# Patient Record
Sex: Male | Born: 1985 | Race: White | Hispanic: No | Marital: Single | Smoking: Never smoker
Health system: Southern US, Community
[De-identification: ages and names within clinical notes are randomized; demographics above are authoritative.]

## PROBLEM LIST (undated history)

## (undated) DIAGNOSIS — F3112 Bipolar disorder, current episode manic without psychotic features, moderate: Secondary | ICD-10-CM

## (undated) DIAGNOSIS — E669 Obesity, unspecified: Secondary | ICD-10-CM

## (undated) DIAGNOSIS — R569 Unspecified convulsions: Secondary | ICD-10-CM

## (undated) DIAGNOSIS — I10 Essential (primary) hypertension: Secondary | ICD-10-CM

## (undated) DIAGNOSIS — M722 Plantar fascial fibromatosis: Secondary | ICD-10-CM

## (undated) HISTORY — PX: TONSILECTOMY, ADENOIDECTOMY, BILATERAL MYRINGOTOMY AND TUBES: SHX2538

## (undated) HISTORY — DX: Plantar fascial fibromatosis: M72.2

## (undated) HISTORY — DX: Unspecified convulsions: R56.9

## (undated) HISTORY — DX: Essential (primary) hypertension: I10

## (undated) HISTORY — DX: Bipolar disorder, current episode manic without psychotic features, moderate: F31.12

---
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
## 2007-02-02 ENCOUNTER — Emergency Department (HOSPITAL_COMMUNITY): Admission: EM | Admit: 2007-02-02 | Discharge: 2007-02-02 | Payer: Self-pay | Admitting: Emergency Medicine

## 2010-05-12 ENCOUNTER — Emergency Department (HOSPITAL_COMMUNITY): Payer: No Typology Code available for payment source

## 2010-05-12 ENCOUNTER — Emergency Department (HOSPITAL_COMMUNITY)
Admission: EM | Admit: 2010-05-12 | Discharge: 2010-05-12 | Disposition: A | Discharge status: Home / Self Care | Payer: No Typology Code available for payment source | Attending: Emergency Medicine | Admitting: Emergency Medicine

## 2010-05-12 DIAGNOSIS — M25519 Pain in unspecified shoulder: Secondary | ICD-10-CM | POA: Insufficient documentation

## 2010-05-12 DIAGNOSIS — M542 Cervicalgia: Secondary | ICD-10-CM | POA: Insufficient documentation

## 2010-05-12 DIAGNOSIS — M79609 Pain in unspecified limb: Secondary | ICD-10-CM | POA: Insufficient documentation

## 2010-05-12 DIAGNOSIS — F319 Bipolar disorder, unspecified: Secondary | ICD-10-CM | POA: Insufficient documentation

## 2010-05-12 DIAGNOSIS — I10 Essential (primary) hypertension: Secondary | ICD-10-CM | POA: Insufficient documentation

## 2010-05-12 DIAGNOSIS — M549 Dorsalgia, unspecified: Secondary | ICD-10-CM | POA: Insufficient documentation

## 2010-11-14 ENCOUNTER — Encounter: Payer: Self-pay | Admitting: Family Medicine

## 2010-11-15 ENCOUNTER — Encounter: Payer: Self-pay | Admitting: Family Medicine

## 2010-11-15 ENCOUNTER — Ambulatory Visit (INDEPENDENT_AMBULATORY_CARE_PROVIDER_SITE_OTHER): Payer: 59 | Admitting: Family Medicine

## 2010-11-15 VITALS — BP 151/102 | HR 78 | Ht 70.5 in | Wt 310.0 lb

## 2010-11-15 DIAGNOSIS — I10 Essential (primary) hypertension: Secondary | ICD-10-CM

## 2010-11-15 DIAGNOSIS — F319 Bipolar disorder, unspecified: Secondary | ICD-10-CM

## 2010-11-15 DIAGNOSIS — M79609 Pain in unspecified limb: Secondary | ICD-10-CM

## 2010-11-15 DIAGNOSIS — M79606 Pain in leg, unspecified: Secondary | ICD-10-CM | POA: Insufficient documentation

## 2010-11-15 MED ORDER — HYDROCHLOROTHIAZIDE 25 MG PO TABS: 25.0000 mg | ORAL_TABLET | Freq: Every day | ORAL | Status: DC

## 2010-11-15 NOTE — Assessment & Plan Note (Signed)
Prescribed lifestyle changes to help with excess weight. Likely related to burden of excess weight with prolonged standing. Will monitor.

## 2010-11-15 NOTE — Assessment & Plan Note (Signed)
Plan for healthy lifestyle changes. Will see patient back in 6-8 weeks to evaluate progress.  1. Exercise 5x a week for 30 minutes 2. Eat 5 servings of fruits and vegetables.  3. Cut down on your sweet tea intake (no sweet tea at home, only 1 sweet tea at work per day).

## 2010-11-15 NOTE — Assessment & Plan Note (Addendum)
Poorly controlled not on any medications. Will start with hydrochlorothiazide as sole therapy for now but suspect will likely need additional medication for control. Given obesity-will start with lifestyle changes-see obesity problem.   Will check fasting lipids (given risk factors)  before next visit as well as a CMET (electrolyte abnormalities)

## 2010-11-15 NOTE — Patient Instructions (Addendum)
It was a pleasure to meet you today!  I am glad I get to be your new physician.   I would like you to schedule an appointment in approximately 6 weeks. Come to clinic 1 week before your appointment to get your blood drawn (be sure to not eat anything after 10pm the night before, water is ok though).   For your blood pressure, I have prescribed hydrochlorothiazide 25 mg. Take this everyday. Also, we talked about some healthy lifestyle changes: 1. Exercise 5x a week for 30 minutes 2. Eat 5 servings of fruits and vegetables.  3. Cut down on your sweet tea intake (no sweet tea at home, only 1 sweet tea at work per day).   I think making these changes and possibly losing some weight should also help the pain in your knees after work.

## 2010-11-15 NOTE — Progress Notes (Signed)
  Subjective:    Patient ID: Juan Grimes, male    DOB: [birth year], 25 y.o.   MRN: [id number]  HPI Juan Grimes is a 25 year old with a past medical history of Bipolar Type I, HTN, and  history of a seizure in 2008 presenting to establish care.   1. Bipolar Type I-on multiple medications including lamictal, saphris, and equetro. These medications are managed by a local psychiatrist. Symptoms well controlled on this regimen.   2. HTN-previously on nifedipine and HCTZ. Has not previously tried lifestyle modifications. Willing to attempt to make healthy lifestyle choices  3. Health Maintenance- Lipid Screening-will check fasting lipids given risk factors (hypertension, obesity).  Flu shot-refuses and hasnt received Tetanus-8 years ago. Patient will need within the next 2 years.  Records-will fax to obtain copy of records.   4. Knee pain-1-2x per week with sharp pain in knees in calves lasting less than 5 minutes after returning home from work and being on feet for long period of time. Resolves within a few minutes. Does not take any medications for relief.    PMhx-not a smoker. Reviewed.  Review of Systems-negative except as noted in HPI       Objective:                  Physical Exam  Vitals reviewed. Constitutional: He is oriented to person, place, and time. He appears well-developed and well-nourished. No distress.       obese  HENT:  Head: Normocephalic and atraumatic.  Mouth/Throat: No oropharyngeal exudate.  Eyes: Conjunctivae are normal. Pupils are equal, round, and reactive to light. Right eye exhibits no discharge. Left eye exhibits no discharge. No scleral icterus.  Neck: Normal range of motion. Neck supple.  Cardiovascular: Normal rate, regular rhythm, normal heart sounds and intact distal pulses.  Exam reveals no gallop and no friction rub.   No murmur heard. Pulmonary/Chest: Effort normal and breath sounds normal. No respiratory distress. He has no wheezes. He  has no rales. He exhibits no tenderness.  Abdominal: Soft. Bowel sounds are normal. He exhibits no distension. There is no tenderness. There is no rebound.  Musculoskeletal: Normal range of motion. He exhibits no edema and no tenderness.  Neurological: He is alert and oriented to person, place, and time. He has normal reflexes.  Skin: Skin is warm and dry. No rash noted. He is not diaphoretic.  Psychiatric: He has a normal mood and affect. His behavior is normal.          Assessment & Plan:

## 2010-11-25 ENCOUNTER — Telehealth: Payer: Self-pay | Admitting: Family Medicine

## 2010-11-25 ENCOUNTER — Encounter: Payer: Self-pay | Admitting: Family Medicine

## 2010-11-25 DIAGNOSIS — Z9989 Dependence on other enabling machines and devices: Secondary | ICD-10-CM

## 2010-11-25 DIAGNOSIS — G4733 Obstructive sleep apnea (adult) (pediatric): Secondary | ICD-10-CM | POA: Insufficient documentation

## 2010-11-25 DIAGNOSIS — R569 Unspecified convulsions: Secondary | ICD-10-CM | POA: Insufficient documentation

## 2010-11-25 NOTE — Telephone Encounter (Signed)
[employer]. We received medical records from their office. Records mentioned history of one seizure on 02/02/2007 and said patient had MRI/EEG scheduled and that he had seen Neuro. The records for a neuro consult and EEG and MRI were not available.   Medical Records was going to investigate to find these records and then either fax the information or give [place] a call if unable to obtain.

## 2010-12-14 LAB — DIFFERENTIAL
Basophils Absolute: 0
Eosinophils Relative: 0
Lymphocytes Relative: 7 — ABNORMAL LOW
Neutro Abs: 14.2 — ABNORMAL HIGH

## 2010-12-14 LAB — I-STAT 8, (EC8 V) (CONVERTED LAB)
Bicarbonate: 23.7
Glucose, Bld: 115 — ABNORMAL HIGH
Hemoglobin: 16.7
Sodium: 140
TCO2: 25

## 2010-12-14 LAB — POCT I-STAT CREATININE: Operator id: 192351

## 2010-12-14 LAB — CBC
HCT: 44.2
Platelets: 246
RDW: 13.7

## 2010-12-14 LAB — RAPID URINE DRUG SCREEN, HOSP PERFORMED
Amphetamines: NOT DETECTED
Benzodiazepines: NOT DETECTED
Cocaine: NOT DETECTED
Tetrahydrocannabinol: NOT DETECTED

## 2010-12-14 LAB — ETHANOL: Alcohol, Ethyl (B): <5

## 2011-03-24 ENCOUNTER — Ambulatory Visit (INDEPENDENT_AMBULATORY_CARE_PROVIDER_SITE_OTHER): Payer: 59 | Admitting: Family Medicine

## 2011-03-24 ENCOUNTER — Encounter: Payer: Self-pay | Admitting: Family Medicine

## 2011-03-24 VITALS — BP 148/87 | HR 84 | Temp 98.0°F | Ht 70.5 in | Wt 322.0 lb

## 2011-03-24 DIAGNOSIS — I10 Essential (primary) hypertension: Secondary | ICD-10-CM

## 2011-03-24 DIAGNOSIS — B356 Tinea cruris: Secondary | ICD-10-CM | POA: Insufficient documentation

## 2011-03-24 DIAGNOSIS — F319 Bipolar disorder, unspecified: Secondary | ICD-10-CM

## 2011-03-24 LAB — COMPREHENSIVE METABOLIC PANEL
AST: 18 U/L (ref 0–37)
BUN: 14 mg/dL (ref 6–23)
CO2: 28 mEq/L (ref 19–32)
Calcium: 9.1 mg/dL (ref 8.4–10.5)
Chloride: 103 mEq/L (ref 96–112)
Creat: 0.73 mg/dL (ref 0.50–1.35)
Glucose, Bld: 91 mg/dL (ref 70–99)

## 2011-03-24 LAB — POCT GLYCOSYLATED HEMOGLOBIN (HGB A1C): Hemoglobin A1C: 5.2

## 2011-03-24 MED ORDER — LISINOPRIL 10 MG PO TABS: 10.0000 mg | ORAL_TABLET | Freq: Every day | ORAL | Status: DC

## 2011-03-24 MED ORDER — TERBINAFINE HCL 1 % EX CREA: TOPICAL_CREAM | Freq: Every day | CUTANEOUS | Status: DC

## 2011-03-24 NOTE — Assessment & Plan Note (Signed)
Will give lamisil cream for 1 week. Can use 2 more weeks if not completely resolved. If not better at that time, patient to come back.

## 2011-03-24 NOTE — Assessment & Plan Note (Addendum)
Will add second agent, lisinopril as SBP still not at goal while Diastolic now with better control. Will check cmet at this visit and recheck bmet next visit to monitor Cr. Check lipids. A1c 5.2-will need to tell patient at next visit.

## 2011-03-24 NOTE — Assessment & Plan Note (Signed)
Continued to encourage lifestyle changes. Patient not currently interested in exercise. He is willing to try to cut back on sweet tea and drink more water (2-3 cups per day). 12 lbs weight gain. Warned patient of long term risks of obesity.

## 2011-03-24 NOTE — Assessment & Plan Note (Signed)
Patient to follow up with psychiatrist due to decreased energy level on medicines. Reviewed Lisinopril with psych meds and no interactions noted.

## 2011-03-24 NOTE — Progress Notes (Signed)
  Subjective:    Patient ID: Juan Grimes, male    DOB: 01/08/1986, 26 y.o.   MRN: [id number]  Juan Grimes is a 26 year old with a past medical history of Bipolar Type I, HTN, and  history of a seizure in 2008 presenting to establish care.   1. HTN-previously on nifedipine and HCTZ. Reported willing to try lifestyle modifications at last visit but did not complete any of these. He has been on HCTZ without issue. Willing to take second agent. No red flags.   2. Patient reports groin itching for 2 months. Says temporarily better after shower. Not tender. Not on scrotum or penis. No burning with urination or polyuria.     3. Bipolar Type I-on multiple medications including lamictal, saphris, and equetro. Patient reports he will see his psychiatrist soon as his energy level has been down. As a result, he has not exercised or been very active outside fo the house. Says rain never affects him positively and there has been a lot lately.   Review of Systems negative except as noted in HPI       Objective:   Physical Exam  Constitutional: He is oriented to person, place, and time. No distress.       Morbidly obese  HENT:  Head: Normocephalic and atraumatic.  Neck: Normal range of motion. Neck supple.  Cardiovascular: Normal rate and regular rhythm.  Exam reveals no gallop and no friction rub.   No murmur heard. Pulmonary/Chest: Effort normal and breath sounds normal. No respiratory distress. He has no wheezes. He has no rales.  Abdominal: Soft. Bowel sounds are normal. He exhibits no distension. There is no tenderness.  Genitourinary: Penis normal. No penile tenderness.       Inspected groin which had a moist appearance and some mild erythema.    Musculoskeletal: Normal range of motion. He exhibits no edema.  Neurological: He is alert and oriented to person, place, and time.  Skin: Skin is warm and dry. He is not diaphoretic.       With exception of moist groin-see genitourinary exam.            Assessment & Plan:  Health Maintenance- Lipid Screening-will check direct LDL (hypertension, obesity).  Flu shot-refuses  Tetanus-reported 4 years ago and otherwise refusing any shots.

## 2011-03-24 NOTE — Patient Instructions (Addendum)
It was good to see you again today, Juan Grimes.   To review: 1. Blood pressure-we are starting a second medicine today, Lisinopril.  2. We are going to check some labs including one for diabetes. I will contact you if there are any changes to your medications based off of labs.  3. For your jock itch, I have sent in a prescription for you.  4. Please try to drink 2-3 cups of water a day and cut down on sweet tea intake. Try unsweetened tea as an alternative.  5. Please see your psychiatrist soon. When you have the energy, I would like for you to try to exercise more.   Thanks, Dr. Durene Cal.

## 2011-03-25 ENCOUNTER — Encounter: Payer: Self-pay | Admitting: Family Medicine

## 2011-03-25 LAB — LDL CHOLESTEROL, DIRECT: Direct LDL: 121 mg/dL — ABNORMAL HIGH

## 2011-05-02 ENCOUNTER — Encounter: Payer: Self-pay | Admitting: Family Medicine

## 2011-05-02 ENCOUNTER — Ambulatory Visit (INDEPENDENT_AMBULATORY_CARE_PROVIDER_SITE_OTHER): Payer: 59 | Admitting: Family Medicine

## 2011-05-02 VITALS — BP 149/79 | HR 91 | Temp 98.3°F | Ht 70.5 in | Wt 315.0 lb

## 2011-05-02 DIAGNOSIS — Z9989 Dependence on other enabling machines and devices: Secondary | ICD-10-CM

## 2011-05-02 DIAGNOSIS — I10 Essential (primary) hypertension: Secondary | ICD-10-CM

## 2011-05-02 DIAGNOSIS — B356 Tinea cruris: Secondary | ICD-10-CM

## 2011-05-02 DIAGNOSIS — G4733 Obstructive sleep apnea (adult) (pediatric): Secondary | ICD-10-CM

## 2011-05-02 MED ORDER — HYDROCHLOROTHIAZIDE 25 MG PO TABS: 25.0000 mg | ORAL_TABLET | Freq: Every day | ORAL | Status: DC

## 2011-05-02 NOTE — Assessment & Plan Note (Signed)
Encouraged lifestyle changes (see AVS).

## 2011-05-02 NOTE — Progress Notes (Signed)
  Subjective:    Patient ID: Juan Grimes, male    DOB: [birth year], 26 y.o.   MRN: [id number]  HPIJoseph is a 26 year old with a past medical history of Bipolar Type I, HTN, OSA previously on CPAP presenting for HTN f/u.   1. HTN-tolerating Lisinopril and HCTZ without side effects. SBP still elevated but with DBP with good control. Patient has tried some lifestyle changes since last visit (increased fruits and veggies, stopping drinking sweet tea, and being more active) resulting in 7 lbs weight loss. Seems motivated today to continue these changes. See #2 For OSA interactions. Would prefer to have labs drawn at next visit for electrolytes.   2. OSA-patient reports he has machine but cannot afford equipment  3. Tinea Cruris-improved with Lamisil spray (couldn't afford cream). No longer itching. No longer taking medications.    4. Bipolar Type I-energy level down at last visit. Improved this visit-says he was in a "slump" but exercise may have helped. Psychiatrist tried to prescribe dexedrine but patient couldn't afford.   Review of Systemsnegative except as noted in HPI      Objective:   Physical Exam  Constitutional: He is oriented to person, place, and time. He appears well-developed and well-nourished. No distress.  HENT:  Head: Normocephalic and atraumatic.  Mouth/Throat: Oropharynx is clear and moist. No oropharyngeal exudate.  Eyes: Pupils are equal, round, and reactive to light.  Neck: Normal range of motion. Neck supple.  Cardiovascular: Normal rate and regular rhythm.  Exam reveals no gallop and no friction rub.   No murmur heard. Pulmonary/Chest: Effort normal and breath sounds normal. No respiratory distress. He has no wheezes. He has no rales.  Abdominal: Soft. Bowel sounds are normal. He exhibits no distension. There is no tenderness. There is no rebound.  Musculoskeletal: Normal range of motion. He exhibits no edema.  Neurological: He is alert and oriented to person,  place, and time.  Skin: Skin is warm and dry.      Assessment & Plan:  BMET next visit for being on Lisinopril. F/u 6 weeks.

## 2011-05-02 NOTE — Assessment & Plan Note (Addendum)
BMET next visit. SBP still high on Lisinopril/HCTZ but DBP well controlled. OSA not being treated as patient cant afford equipment-likely contributing.  Patient plans for lifestyle changes. Alternatively, can increase Lisinopril to 20mg . On 03/24/11-A1c 5.2. LDL 121. Goal <160 due to only 1 risk factor.  Updated patient on labs.

## 2011-05-02 NOTE — Assessment & Plan Note (Signed)
Patient not taking CPAP due affordability of equipment. Likely contributing to HTN.

## 2011-05-02 NOTE — Assessment & Plan Note (Signed)
Much improved with lamisil spray.

## 2011-05-02 NOTE — Patient Instructions (Addendum)
Dear Mr. Daeshon Grammatico [facility],   It was great to see you today. Thank you for coming to clinic. Please read below regarding the issues that we discussed.   1. Blood pressure-the Lisinopril has helped your bottom number improve. Your top number is still high. I think we can improve this number by continuing healthy lifestyle changes. You lost 7 lbs in 1 month due to stopping drinking tea and increasing fruits and vegetables -I want you to start walking or doing another form of exercise for 15-20 minutes per day 4x per week.  -also try to increase vegetables to a minimum of 2-3 servings per day and fruits to a minimum of 3-4 servings (that will get you 5 total) 2. For your jock itch, I am glad the Lamisil worked for you. If it ever flares up again, you can try the lamisil again.  3. I am glad your energy level improved with exercise.  4. Your lab work looked great from your last visit.   Please follow up in clinic in 6 weeks . Please call earlier if you have any questions or concerns.   Sincerely,  Dr. Tana Conch

## 2011-06-14 ENCOUNTER — Encounter: Payer: Self-pay | Admitting: Family Medicine

## 2011-06-14 ENCOUNTER — Ambulatory Visit (INDEPENDENT_AMBULATORY_CARE_PROVIDER_SITE_OTHER): Payer: 59 | Admitting: Family Medicine

## 2011-06-14 VITALS — BP 137/79 | HR 89 | Temp 98.3°F | Ht 70.5 in | Wt 307.0 lb

## 2011-06-14 DIAGNOSIS — I10 Essential (primary) hypertension: Secondary | ICD-10-CM

## 2011-06-14 DIAGNOSIS — G4733 Obstructive sleep apnea (adult) (pediatric): Secondary | ICD-10-CM

## 2011-06-14 DIAGNOSIS — F319 Bipolar disorder, unspecified: Secondary | ICD-10-CM

## 2011-06-14 NOTE — Progress Notes (Addendum)
Patient ID: NICHOLE Grimes, male   DOB: 1985/12/08, 26 y.o.   MRN: [id number]  Subjective:    Patient ID: Juan Grimes, male    DOB: 1986/03/07, 26 y.o.   MRN: [id number]  HPIJoseph is a 26 year old with a past medical history of Bipolar Type I, HTN, OSA previously on CPAP presenting for HTN f/u.   1. HTN-tolerating Lisinopril and HCTZ without side effects. BP now at goal <140/90. Patient continues efforts for lifestyle changes since last visit (increased fruits and veggies, stopping drinking sweet tea, and being more active) resulting in an additional 8 lbs weight loss (now down 15 lbs in total from peak) Very motivated today to continue these changes.  Denies CP, SOB, blurry vision, headaches.   2. OSA-patient reports he has machine but cannot afford equipment. He is unsure if he is snoring. Does have some daytime somnolence.   3. Obesity-eating more salads, smaller portion sizes, still not drinking tea (occaasional light lemonade with some tea in it). 2-3 veggies per day. 1-2 fruits per day.   4. Bipolar Type I-energy level continues to be improved with exercise. Continues to follow up with Psychiatry   Review of Systems -See HPI  Past Medical History-smoking status noted: never smoker. Reviewed problem list.  Medications- reviewed and updated Chief complaint-noted      Objective:   Physical Exam  Constitutional: He is oriented to person, place, and time. He appears well-developed and well-nourished. No distress.  HENT:  Head: Normocephalic and atraumatic.  Mouth/Throat: Oropharynx is clear and moist. No oropharyngeal exudate.  Eyes: Pupils are equal, round, and reactive to light.  Neck: Normal range of motion. Neck supple.  Cardiovascular: Normal rate and regular rhythm.  Exam reveals no gallop and no friction rub.   No murmur heard. Pulmonary/Chest: Effort normal and breath sounds normal. No respiratory distress. He has no wheezes. He has no rales.  Abdominal: Soft. Bowel  sounds are normal. He exhibits no distension. There is no tenderness. There is no rebound.  Musculoskeletal: Normal range of motion. He exhibits no edema.  Neurological: He is alert and oriented to person, place, and time.  Skin: Skin is warm and dry.      Assessment & Plan:  F/u 2 months

## 2011-06-14 NOTE — Assessment & Plan Note (Signed)
Can't afford equipment. OSA treatment at this time by treating obesity. Patient will let us know when he can afford equipment. Does have some daytime somnolence. Unsure if snoring.

## 2011-06-14 NOTE — Assessment & Plan Note (Signed)
Improved energy levels. Patient seems to be in bright spirits. Laughs and very interactive-which has improved in recent visits. Continues to follow up with Psychiatry.

## 2011-06-14 NOTE — Patient Instructions (Signed)
Dear  Lytle Michaels,   It was great to see you today. Thank you for coming to clinic. Please read below regarding the issues that we discussed.   1. Blood pressure-has improved and is now at goal of < 140/90. This is likely related to all your hard work with exercise and eating better. Keep taking the same medications.  2. Weight loss-you have now lost a total of 15 lbs from your heaviest -I want you to continue your increased activity outdoors. Your goal that you set is 15-20 minutes per day 4x per week. I want you to keep striving for that goal.  -You have increased your fruits and vegetables. Keep aiming for your goal of vegetables a minimum of 2-3 servings per day and fruits to a minimum of 3-4 servings (that will get you 5 total)  3. Sleep apnea-if you ever feel like you are in a position to afford the machine again, please let us know and we will help you out with that.  4. We will send you for labs today. I will send you your results by mail.    Please follow up in clinic in 8 weeks . Please call earlier if you have any questions or concerns.   Sincerely,  Dr. Tana Conch

## 2011-06-14 NOTE — Assessment & Plan Note (Signed)
Blood pressure finally at goal, likely due to weight loss, improved diet. Will follow up in 2 months. BMET today to monitor electrolytes.

## 2011-06-14 NOTE — Assessment & Plan Note (Signed)
Plan per AVS. Patient has short term goal of weight <300 lbs.

## 2011-06-15 LAB — BASIC METABOLIC PANEL
Potassium: 3.7 mEq/L (ref 3.5–5.3)
Sodium: 141 mEq/L (ref 135–145)

## 2011-06-19 ENCOUNTER — Encounter: Payer: Self-pay | Admitting: Family Medicine

## 2012-05-03 ENCOUNTER — Other Ambulatory Visit: Payer: Self-pay | Admitting: Family Medicine

## 2012-05-13 ENCOUNTER — Other Ambulatory Visit: Payer: Self-pay | Admitting: Family Medicine

## 2012-06-15 ENCOUNTER — Encounter (HOSPITAL_COMMUNITY): Payer: Self-pay | Admitting: Emergency Medicine

## 2012-06-15 ENCOUNTER — Emergency Department (HOSPITAL_COMMUNITY)
Admission: EM | Admit: 2012-06-15 | Discharge: 2012-06-15 | Disposition: A | Discharge status: Home / Self Care | Payer: Self-pay | Attending: Emergency Medicine | Admitting: Emergency Medicine

## 2012-06-15 DIAGNOSIS — I10 Essential (primary) hypertension: Secondary | ICD-10-CM | POA: Insufficient documentation

## 2012-06-15 DIAGNOSIS — W260XXA Contact with knife, initial encounter: Secondary | ICD-10-CM | POA: Insufficient documentation

## 2012-06-15 DIAGNOSIS — Y92009 Unspecified place in unspecified non-institutional (private) residence as the place of occurrence of the external cause: Secondary | ICD-10-CM | POA: Insufficient documentation

## 2012-06-15 DIAGNOSIS — E669 Obesity, unspecified: Secondary | ICD-10-CM | POA: Insufficient documentation

## 2012-06-15 DIAGNOSIS — Z8739 Personal history of other diseases of the musculoskeletal system and connective tissue: Secondary | ICD-10-CM | POA: Insufficient documentation

## 2012-06-15 DIAGNOSIS — Z23 Encounter for immunization: Secondary | ICD-10-CM | POA: Insufficient documentation

## 2012-06-15 DIAGNOSIS — Y93G1 Activity, food preparation and clean up: Secondary | ICD-10-CM | POA: Insufficient documentation

## 2012-06-15 DIAGNOSIS — S61209A Unspecified open wound of unspecified finger without damage to nail, initial encounter: Secondary | ICD-10-CM | POA: Insufficient documentation

## 2012-06-15 DIAGNOSIS — G40909 Epilepsy, unspecified, not intractable, without status epilepticus: Secondary | ICD-10-CM | POA: Insufficient documentation

## 2012-06-15 DIAGNOSIS — F319 Bipolar disorder, unspecified: Secondary | ICD-10-CM | POA: Insufficient documentation

## 2012-06-15 DIAGNOSIS — Z79899 Other long term (current) drug therapy: Secondary | ICD-10-CM | POA: Insufficient documentation

## 2012-06-15 HISTORY — DX: Obesity, unspecified: E66.9

## 2012-06-15 MED ORDER — HYDROCODONE-ACETAMINOPHEN 5-325 MG PO TABS: 1.0000 | ORAL_TABLET | Freq: Four times a day (QID) | ORAL | Status: DC | PRN

## 2012-06-15 MED ORDER — HYDROCODONE-ACETAMINOPHEN 5-325 MG PO TABS
2.0000 | ORAL_TABLET | Freq: Once | ORAL | Status: AC
  Administered 2012-06-15: 2 via ORAL
  Filled 2012-06-15: qty 2

## 2012-06-15 MED ORDER — TETANUS-DIPHTH-ACELL PERTUSSIS 5-2.5-18.5 LF-MCG/0.5 IM SUSP
0.5000 mL | Freq: Once | INTRAMUSCULAR | Status: AC
  Administered 2012-06-15: 0.5 mL via INTRAMUSCULAR
  Filled 2012-06-15: qty 0.5

## 2012-06-15 MED ORDER — CEPHALEXIN 500 MG PO CAPS: 500.0000 mg | ORAL_CAPSULE | Freq: Four times a day (QID) | ORAL | Status: DC

## 2012-06-15 MED ORDER — CEPHALEXIN 250 MG PO CAPS
1000.0000 mg | ORAL_CAPSULE | Freq: Once | ORAL | Status: AC
  Administered 2012-06-15: 1000 mg via ORAL
  Filled 2012-06-15: qty 4

## 2012-06-15 NOTE — ED Notes (Addendum)
PT. ACCIDENTALLY SLICED SKIN OF HIS RIGHT MIDDLE FINGER BY A POTATO CUTTER THIS EVENING AT HOME . PRESSURE APPLIED AT TRIAGE.

## 2012-06-15 NOTE — ED Provider Notes (Addendum)
History     CSN: [id number]  Arrival date & time 06/15/12  0102   First MD Initiated Contact with Patient 06/15/12 [phone]      Chief Complaint  Patient presents with  . Finger Injury    (Consider location/radiation/quality/duration/timing/severity/associated sxs/prior treatment) HPI This is a 27 year old male who was cutting potatoes this morning and slipped, slicing a piece of skin off the dorsum of his right middle finger at about the distal interphalangeal joint. There was severe bleeding at the time, which was "squirting" but has resolved with application of a pressure dressing in triage. There is moderate pain associated with this. Tendon function and sensation are intact. He denies other injury. He has not had a tetanus shot in the past 10 years.  Past Medical History  Diagnosis Date  . Bipolar 1 disorder, manic, moderate   . Seizure     reported massive seizure in 2008  . Plantar fasciitis   . HTN (hypertension)     normal renin 2008  . Obese     Past Surgical History  Procedure Laterality Date  . Tonsilectomy, adenoidectomy, bilateral myringotomy and tubes      1996    Family History  Problem Relation Age of Onset  . Bipolar disorder Father   . Hypertension Father   . Fibromyalgia Mother     History  Substance Use Topics  . Smoking status: Never Smoker   . Smokeless tobacco: Not on file  . Alcohol Use: 1.0 oz/week    2 drink(s) per week      Review of Systems  All other systems reviewed and are negative.    Allergies  Review of patient's allergies indicates no known allergies.  Home Medications   Current Outpatient Rx  Name  Route  Sig  Dispense  Refill  . hydrochlorothiazide (HYDRODIURIL) 25 MG tablet   Oral   Take 25 mg by mouth daily.         Marland Kitchen lamoTRIgine (LAMICTAL) 200 MG tablet   Oral   Take 200 mg by mouth 2 (two) times daily.          Marland Kitchen lisinopril (PRINIVIL,ZESTRIL) 10 MG tablet   Oral   Take 1 tablet (10 mg total) by mouth  daily.   90 tablet   3   . LORazepam (ATIVAN) 1 MG tablet   Oral   Take 2 mg by mouth every 6 (six) hours as needed for anxiety.         Marland Kitchen QUEtiapine (SEROQUEL XR) 200 MG 24 hr tablet   Oral   Take 200 mg by mouth at bedtime.           BP 178/100  Pulse 92  Temp(Src) 99.1 F (37.3 C) (Oral)  Resp 14  SpO2 97%  Physical Exam General: Well-developed, well-nourished male in no acute distress; appearance consistent with age of record HENT: normocephalic, atraumatic Eyes: pupils equal round and reactive to light; extraocular muscles intact Neck: supple Heart: regular rate and rhythm Lungs: clear to auscultation bilaterally Abdomen: soft; nondistended Extremities: No deformity; full range of motion; pulses normal; skin defect about 1.5 x 2 cm overlying the dorsolateral aspect of the right middle finger at the distal interphalangeal joint; flexion and extension function are intact at the distal interphalangeal joint of the right middle finger, sensation is intact distally and distal capillary refill is brisk Neurologic: Awake, alert and oriented; motor function intact in all extremities and symmetric; no facial droop Skin: Warm and dry Psychiatric: Normal  mood and affect    ED Course  Procedures (including critical care time)    MDM  The wound is not amenable to primary repair. He does not appear to involve deep structures, no tendon or joint is seen in the wound. It may require skin grafting. We will refer to Dr. Amanda Pea and have him evaluate the wound later today.  We will dress the wound with Xeroform gauze and instruct him regarding dressing changes.       Hanley Seamen, MD 06/15/12 1610  Hanley Seamen, MD 06/15/12 [phone]

## 2012-06-26 ENCOUNTER — Emergency Department (HOSPITAL_COMMUNITY)
Admission: EM | Admit: 2012-06-26 | Discharge: 2012-06-26 | Disposition: A | Discharge status: Home / Self Care | Payer: Self-pay | Attending: Emergency Medicine | Admitting: Emergency Medicine

## 2012-06-26 ENCOUNTER — Encounter (HOSPITAL_COMMUNITY): Payer: Self-pay | Admitting: Nurse Practitioner

## 2012-06-26 ENCOUNTER — Emergency Department (HOSPITAL_COMMUNITY): Payer: Self-pay

## 2012-06-26 DIAGNOSIS — Y9389 Activity, other specified: Secondary | ICD-10-CM | POA: Insufficient documentation

## 2012-06-26 DIAGNOSIS — Y929 Unspecified place or not applicable: Secondary | ICD-10-CM | POA: Insufficient documentation

## 2012-06-26 DIAGNOSIS — Z79899 Other long term (current) drug therapy: Secondary | ICD-10-CM | POA: Insufficient documentation

## 2012-06-26 DIAGNOSIS — S61209D Unspecified open wound of unspecified finger without damage to nail, subsequent encounter: Secondary | ICD-10-CM

## 2012-06-26 DIAGNOSIS — I10 Essential (primary) hypertension: Secondary | ICD-10-CM | POA: Insufficient documentation

## 2012-06-26 DIAGNOSIS — S61209A Unspecified open wound of unspecified finger without damage to nail, initial encounter: Secondary | ICD-10-CM | POA: Insufficient documentation

## 2012-06-26 DIAGNOSIS — G40909 Epilepsy, unspecified, not intractable, without status epilepticus: Secondary | ICD-10-CM | POA: Insufficient documentation

## 2012-06-26 DIAGNOSIS — E669 Obesity, unspecified: Secondary | ICD-10-CM | POA: Insufficient documentation

## 2012-06-26 DIAGNOSIS — W298XXA Contact with other powered powered hand tools and household machinery, initial encounter: Secondary | ICD-10-CM | POA: Insufficient documentation

## 2012-06-26 DIAGNOSIS — F3112 Bipolar disorder, current episode manic without psychotic features, moderate: Secondary | ICD-10-CM | POA: Insufficient documentation

## 2012-06-26 MED ORDER — HYDROCODONE-ACETAMINOPHEN 5-325 MG PO TABS: 1.0000 | ORAL_TABLET | Freq: Four times a day (QID) | ORAL | Status: DC | PRN

## 2012-06-26 MED ORDER — GLUCOSE 40 % PO GEL: 1.0000 | Freq: Once | ORAL | Status: DC

## 2012-06-26 NOTE — ED Provider Notes (Signed)
History     CSN: [id number]  Arrival date & time 06/26/12  1144   First MD Initiated Contact with Patient 06/26/12 1152      Chief Complaint  Patient presents with  . Finger Injury    (Consider location/radiation/quality/duration/timing/severity/associated sxs/prior treatment) HPI Comments: Patient presenting to have a wound on his right 3rd digit rechecked.  Eleven days ago he cut his finger with a vegetable slicer.  He had a skin avulsion injury.  He was seen in the ED at that time.  Wound unable to be repaired with sutures.  Patient was started on a 7 day course of Keflex and given referral to Hand Surgery.   Patient did complete antibiotics, but never followed up with Hand Surgery for financial reasons.  He reports that he continues to have some pain of the area of the wound and has noticed a small amount of clear discharge.  He denies any purulent discharge.  Denies any surrounding erythema or warmth.  He denies fever or chills.  He has full ROM of the finger.  He had been taking Norco for the pain, which helped.  The history is provided by the patient.    Past Medical History  Diagnosis Date  . Bipolar 1 disorder, manic, moderate   . Seizure     reported massive seizure in 2008  . Plantar fasciitis   . HTN (hypertension)     normal renin 2008  . Obese     Past Surgical History  Procedure Laterality Date  . Tonsilectomy, adenoidectomy, bilateral myringotomy and tubes      1996    Family History  Problem Relation Age of Onset  . Bipolar disorder Father   . Hypertension Father   . Fibromyalgia Mother     History  Substance Use Topics  . Smoking status: Never Smoker   . Smokeless tobacco: Not on file  . Alcohol Use: 1.0 oz/week    2 drink(s) per week      Review of Systems  Constitutional: Negative for fever and chills.  Skin: Positive for wound.  All other systems reviewed and are negative.    Allergies  Review of patient's allergies indicates no known  allergies.  Home Medications   Current Outpatient Rx  Name  Route  Sig  Dispense  Refill  . hydrochlorothiazide (HYDRODIURIL) 25 MG tablet   Oral   Take 25 mg by mouth daily.         Marland Kitchen lamoTRIgine (LAMICTAL) 200 MG tablet   Oral   Take 200 mg by mouth 2 (two) times daily.          Marland Kitchen lisinopril (PRINIVIL,ZESTRIL) 10 MG tablet   Oral   Take 1 tablet (10 mg total) by mouth daily.   90 tablet   3   . LORazepam (ATIVAN) 1 MG tablet   Oral   Take 2 mg by mouth every 6 (six) hours as needed for anxiety.         Marland Kitchen QUEtiapine (SEROQUEL XR) 200 MG 24 hr tablet   Oral   Take 200 mg by mouth at bedtime.         . cephALEXin (KEFLEX) 500 MG capsule   Oral   Take 1 capsule (500 mg total) by mouth 4 (four) times daily.   28 capsule   0   . HYDROcodone-acetaminophen (NORCO) 5-325 MG per tablet   Oral   Take 1-2 tablets by mouth every 6 (six) hours as needed for pain.  20 tablet   0     BP 174/98  Pulse 109  Temp(Src) 98.1 F (36.7 C) (Oral)  Resp 20  SpO2 100%  Physical Exam  Nursing note and vitals reviewed. Constitutional: He appears well-developed and well-nourished.  HENT:  Head: Normocephalic and atraumatic.  Neck: Normal range of motion. Neck supple.  Cardiovascular: Normal rate, regular rhythm and normal heart sounds.   Pulmonary/Chest: Effort normal and breath sounds normal.  Musculoskeletal:  Full ROM of the right 3rd digit at the level of the DIP, PIP, and the MCP  Neurological: He is alert.  Skin: Skin is warm.  Approximately 1 cm diameter wound of the dorsal right 3rd digit appears to be healing with epithelial cells.  NO drainage.  No surrounding erythema or warmth.   Good capillary refill of the right middle finger  Psychiatric: He has a normal mood and affect.    ED Course  Procedures (including critical care time)  Labs Reviewed - No data to display Dg Finger Middle Right  06/26/2012  *RADIOLOGY REPORT*  Clinical Data: Laceration, pain.   RIGHT MIDDLE FINGER 2+V  Comparison: None.  Findings: Soft tissue swelling at the DIP joint dorsally.  No radiopaque foreign body or fracture.  No dislocation.  IMPRESSION: Dorsal soft tissue swelling. No Osseous findings.   Original Report Authenticated By: Davonna Belling, M.D.      No diagnosis found.    MDM  Patient presenting for wound recheck.  He completed a course of Keflex.  No signs of infection at this time.  Full ROM of the finger.  Xray negative.  At his last visit the patient was given follow up with Hand Surgery, but patient did not follow up for financial reasons.  Patient instructed to follow up with his PCP to ensure proper healing.  Return precautions discussed.        Pascal Lux Leonard, PA-C 06/27/12 [phone]

## 2012-06-26 NOTE — ED Notes (Signed)
Pt reports he sliced top of R middle finger off with vegetable slicer last Wednesday, was seen here and given abx which he took as directed. States he continues to have pain and drainage at site. Pt can not return to work until he has a note saying its okay for him to return. Pt is htn now states he forgot to take bp meds this am

## 2012-06-28 NOTE — ED Provider Notes (Signed)
Medical screening examination/treatment/procedure(s) were performed by non-physician practitioner and as supervising physician I was immediately available for consultation/collaboration.  Donnetta Hutching, MD 06/28/12 [phone]

## 2012-12-03 ENCOUNTER — Other Ambulatory Visit: Payer: Self-pay | Admitting: Family Medicine

## 2013-07-10 ENCOUNTER — Encounter: Payer: Self-pay | Admitting: Family Medicine

## 2013-07-10 ENCOUNTER — Ambulatory Visit (INDEPENDENT_AMBULATORY_CARE_PROVIDER_SITE_OTHER): Payer: 59 | Admitting: Family Medicine

## 2013-07-10 VITALS — BP 153/98 | HR 92 | Temp 98.6°F | Wt 339.0 lb

## 2013-07-10 DIAGNOSIS — Z79899 Other long term (current) drug therapy: Secondary | ICD-10-CM

## 2013-07-10 DIAGNOSIS — I10 Essential (primary) hypertension: Secondary | ICD-10-CM

## 2013-07-10 MED ORDER — HYDROCHLOROTHIAZIDE 25 MG PO TABS: 25.0000 mg | ORAL_TABLET | Freq: Every day | ORAL | Status: DC

## 2013-07-10 MED ORDER — LISINOPRIL 10 MG PO TABS: 10.0000 mg | ORAL_TABLET | Freq: Every day | ORAL | Status: DC

## 2013-07-10 NOTE — Assessment & Plan Note (Signed)
Discussed need for weight loss. See AVS and HPI.

## 2013-07-10 NOTE — Progress Notes (Signed)
Garret Reddish, MD Phone: [phone]  Subjective:   Juan Grimes is a 28 y.o. year old very pleasant male patient who presents with the following:  Hypertension BP Readings from Last 3 Encounters:  07/10/13 153/98  06/26/12 136/91  06/15/12 145/80   Home BP monitoring-yes, has checked and typically 145-160 SBP Compliant with medications-no, ran out several months ago  ROS-Denies any CP, HA, SOB, blurry vision, LE edema, transient weakness, orthopnea, PND.  Endorses mild thoracic left sided back pain after lifting box several weeks ago but is gradually improving and no fecal or urinary incontinence or leg weakness  Morbid Obesity States a few months after last visit, stopped efforts to lose weight. He was drinking lots of tea and not exercising. About a week ago before coming to this visit, he got inspired to change back to old habits nad is now walking 20-30 minutes 3-4x a week and drinking mainly water Of note, is on seroquel.  ROS- no unintentional weight gain  Past Medical History- uses dip/chew (advised to quit)  Patient Active Problem List   Diagnosis Date Noted  . OSA on CPAP 11/25/2010    Priority: High  . HTN (hypertension) 11/15/2010    Priority: High  . Bipolar I disorder 11/15/2010    Priority: High  . History of Seizure 11/25/2010    Priority: Medium  . Obesity, morbid (BMI > 40) 11/15/2010    Priority: Medium  . Knee pain after work (morbid obesity and on feet) 11/15/2010    Priority: Low   Medications- reviewed and updated Current Outpatient Prescriptions  Medication Sig Dispense Refill  . hydrochlorothiazide (HYDRODIURIL) 25 MG tablet Take 1 tablet (25 mg total) by mouth daily.  90 tablet  3  . lisinopril (PRINIVIL,ZESTRIL) 10 MG tablet Take 1 tablet (10 mg total) by mouth daily.  90 tablet  3  . LORazepam (ATIVAN) 1 MG tablet Take 2 mg by mouth every 6 (six) hours as needed for anxiety.      Marland Kitchen QUEtiapine (SEROQUEL XR) 200 MG 24 hr tablet Take 200  mg by mouth at bedtime.       No current facility-administered medications for this visit.    Objective: BP 153/98  Pulse 92  Temp(Src) 98.6 F (37 C) (Oral)  Wt 339 lb (153.769 kg) Gen: NAD, resting comfortably CV: RRR no murmurs rubs or gallops Lungs: CTAB no crackles, wheeze, rhonchi Abdomen: soft/nontender/nondistended/normal bowel sounds. No rebound or guarding.  Ext: no edema Skin: warm, dry Neuro: grossly normal, moves all extremities  Assessment/Plan:  HTN (hypertension) Poorly controlled. Restarted HCTZ and lisinopri 10mg . Patient to call with BP at [facility] within 1-2 weeks. He is to follow up 6 months. Will come back for labwork as may need some additional labs from psychiatry. Otherwise, see orders entered today.   Obesity, morbid (BMI > 40) Discussed need for weight loss. See AVS and HPI.     Orders Placed This Encounter  Procedures  . Comprehensive metabolic panel    Standing Status: Future     Number of Occurrences:      Standing Expiration Date: 07/11/2014  . CBC    Standing Status: Future     Number of Occurrences:      Standing Expiration Date: 07/11/2014  . Lipid panel    Standing Status: Future     Number of Occurrences:      Standing Expiration Date: 07/10/2014  . POCT glycosylated hemoglobin (Hb A1C)    Standing Status: Future  Number of Occurrences:      Standing Expiration Date: 07/11/2015    Meds ordered this encounter  Medications  . lisinopril (PRINIVIL,ZESTRIL) 10 MG tablet    Sig: Take 1 tablet (10 mg total) by mouth daily.    Dispense:  90 tablet    Refill:  3  . hydrochlorothiazide (HYDRODIURIL) 25 MG tablet    Sig: Take 1 tablet (25 mg total) by mouth daily.    Dispense:  90 tablet    Refill:  3

## 2013-07-10 NOTE — Patient Instructions (Addendum)
Blood pressure  High off of medicine-restarted today  Goal to be less than 140/90  Please check at [facility] over next 1-2 weeks and call me with your readings once back on medicine  For your psychiatrist, call and see if they need any other labs than the following: You will need to call to schedule a lab visit before coming in for these Orders Placed This Encounter  Procedures  . Comprehensive metabolic panel  . CBC  . Lipid panel  . POCT glycosylated hemoglobin (Hb A1C)   Check in with [place] in 6 months,  Dr. Algie Coffer also decided to work on your healthy lifestyle choices. Here are some tips. Bolded are things you agreed to today My 5 to Fitness!  5: fruits and vegetables per day (work on 9 per day if you are at 5) 4: exercise 4-5 times per week for at least 30 minutes (walking counts!) 3: meals per day (don't skip breakfast!) 2: habits to quit  -smoking/ dip/chew  -excess alcohol use (men >2 beer/day; women >1beer/day) 1: sweet per day (2 cookies, 1 small cup of ice cream, 12 oz soda) 0: sugar sweetened beverages (except if as substitute for your sweet for the day)   These are general tips for healthy living. Try to start with 1 or 2 habit TODAY and make it a part of your life for several months.  Once you have 1 or 2 habits down for several months, try to begin working on your next healthy habit. With every single step you take, you will be leading a healthier lifestyle!

## 2013-07-10 NOTE — Assessment & Plan Note (Signed)
Poorly controlled. Restarted HCTZ and lisinopri 10mg . Patient to call with BP at [facility] within 1-2 weeks. He is to follow up 6 months. Will come back for labwork as may need some additional labs from psychiatry. Otherwise, see orders entered today.

## 2013-08-21 ENCOUNTER — Emergency Department (HOSPITAL_COMMUNITY): Payer: Self-pay

## 2013-08-21 ENCOUNTER — Encounter (HOSPITAL_COMMUNITY): Payer: Self-pay | Admitting: Emergency Medicine

## 2013-08-21 ENCOUNTER — Emergency Department (HOSPITAL_COMMUNITY)
Admission: EM | Admit: 2013-08-21 | Discharge: 2013-08-21 | Disposition: A | Discharge status: Home / Self Care | Payer: Self-pay | Attending: Emergency Medicine | Admitting: Emergency Medicine

## 2013-08-21 DIAGNOSIS — Z79899 Other long term (current) drug therapy: Secondary | ICD-10-CM | POA: Insufficient documentation

## 2013-08-21 DIAGNOSIS — Z8739 Personal history of other diseases of the musculoskeletal system and connective tissue: Secondary | ICD-10-CM | POA: Insufficient documentation

## 2013-08-21 DIAGNOSIS — F3112 Bipolar disorder, current episode manic without psychotic features, moderate: Secondary | ICD-10-CM | POA: Insufficient documentation

## 2013-08-21 DIAGNOSIS — Y9269 Other specified industrial and construction area as the place of occurrence of the external cause: Secondary | ICD-10-CM | POA: Insufficient documentation

## 2013-08-21 DIAGNOSIS — I10 Essential (primary) hypertension: Secondary | ICD-10-CM | POA: Insufficient documentation

## 2013-08-21 DIAGNOSIS — E669 Obesity, unspecified: Secondary | ICD-10-CM | POA: Insufficient documentation

## 2013-08-21 DIAGNOSIS — Y93D3 Activity, furniture building and finishing: Secondary | ICD-10-CM | POA: Insufficient documentation

## 2013-08-21 DIAGNOSIS — IMO0002 Reserved for concepts with insufficient information to code with codable children: Secondary | ICD-10-CM | POA: Insufficient documentation

## 2013-08-21 DIAGNOSIS — S99929A Unspecified injury of unspecified foot, initial encounter: Principal | ICD-10-CM

## 2013-08-21 DIAGNOSIS — S99919A Unspecified injury of unspecified ankle, initial encounter: Principal | ICD-10-CM

## 2013-08-21 DIAGNOSIS — S8990XA Unspecified injury of unspecified lower leg, initial encounter: Secondary | ICD-10-CM | POA: Insufficient documentation

## 2013-08-21 DIAGNOSIS — Z8669 Personal history of other diseases of the nervous system and sense organs: Secondary | ICD-10-CM | POA: Insufficient documentation

## 2013-08-21 MED ORDER — HYDROCODONE-ACETAMINOPHEN 5-325 MG PO TABS: 1.0000 | ORAL_TABLET | Freq: Four times a day (QID) | ORAL | Status: DC | PRN

## 2013-08-21 NOTE — ED Provider Notes (Signed)
CSN: [id number]     Arrival date & time 08/21/13  1538 History  This chart was scribed for non-physician practitioner Lorre Munroe, PA-C working with Richarda Blade, MD by Eston Mould, ED Scribe. This patient was seen in room TR07C/TR07C and the patient's care was started at 4:48 PM .   Chief Complaint  Patient presents with  . Leg Pain   The history is provided by the patient. No language interpreter was used.   HPI Comments: Juan Grimes is a 28 y.o. male who presents to the Emergency Department complaining of L leg pain due to an injury that occurred today. 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 States he has been limping since the incident occurred. Rates pain while seated 7/10 and while standing 10/10. States he has pain with ROM to L ankle. Denies any other injuries.    Past Medical History  Diagnosis Date  . Bipolar 1 disorder, manic, moderate   . Seizure     reported massive seizure in 2008  . Plantar fasciitis   . HTN (hypertension)     normal renin 2008  . Obese    Past Surgical History  Procedure Laterality Date  . Tonsilectomy, adenoidectomy, bilateral myringotomy and tubes      1996   Family History  Problem Relation Age of Onset  . Bipolar disorder Father   . Hypertension Father   . Fibromyalgia Mother    History  Substance Use Topics  . Smoking status: Never Smoker   . Smokeless tobacco: Current User    Types: Snuff  . Alcohol Use: 1.0 oz/week    2 drink(s) per week    Review of Systems  Constitutional: Negative for appetite change and fatigue.  HENT: Negative for congestion, ear discharge and sinus pressure.   Eyes: Negative for discharge.  Respiratory: Negative for cough.   Cardiovascular: Negative for chest pain.  Gastrointestinal:  Negative for abdominal pain and diarrhea.  Genitourinary: Negative for frequency and hematuria.  Musculoskeletal: Negative for back pain.       L leg pain  Skin: Negative for rash.  Neurological: Negative for seizures and headaches.  Psychiatric/Behavioral: Negative for hallucinations.   Allergies  Review of patient's allergies indicates no known allergies.  Home Medications   Prior to Admission medications   Medication Sig Start Date End Date Taking? Authorizing Provider  hydrochlorothiazide (HYDRODIURIL) 25 MG tablet Take 1 tablet (25 mg total) by mouth daily. 07/10/13   Marin Olp, MD  lisinopril (PRINIVIL,ZESTRIL) 10 MG tablet Take 1 tablet (10 mg total) by mouth daily. 07/10/13 10/13/14  Marin Olp, MD  LORazepam (ATIVAN) 1 MG tablet Take 2 mg by mouth every 6 (six) hours as needed for anxiety.    Historical Provider, MD  QUEtiapine (SEROQUEL XR) 200 MG 24 hr tablet Take 200 mg by mouth at bedtime.    Historical Provider, MD   BP 164/102  Pulse 102  Temp(Src) 98.7 F (37.1 C) (Oral)  Resp 18  Ht 6' (1.829 m)  Wt 339 lb (153.769 kg)  BMI 45.97 kg/m2  SpO2 97%  Physical Exam  Nursing note and vitals reviewed. Constitutional: He is oriented to person, place, and time. He appears well-developed and well-nourished.  HENT:  Head: Normocephalic  and atraumatic.  Eyes: Conjunctivae and EOM are normal. Pupils are equal, round, and reactive to light. Right eye exhibits no discharge. Left eye exhibits no discharge. No scleral icterus.  Neck: Normal range of motion. Neck supple. No JVD present.  Cardiovascular: Normal rate, regular rhythm and normal heart sounds.  Exam reveals no gallop and no friction rub.   No murmur heard. Pulmonary/Chest: Effort normal and breath sounds normal. No respiratory distress. He has no wheezes. He has no rales. He exhibits no tenderness.  Abdominal: Soft. He exhibits no distension and no mass. There is no tenderness. There is no rebound and no  guarding.  Musculoskeletal: Normal range of motion. He exhibits no edema and no tenderness.  Mild masses to the anterior left shin, no bony abnormality or deformity, range of motion and strength 5/5  Neurological: He is alert and oriented to person, place, and time.  Skin: Skin is warm and dry.  Psychiatric: He has a normal mood and affect. His behavior is normal. Judgment and thought content normal.    ED Course  Procedures (including critical care time) DIAGNOSTIC STUDIES: Oxygen Saturation is 97% on RA, normal by my interpretation.    COORDINATION OF CARE: 4:50 PM-Discussed treatment plan which includes Tib/Fib X-ray and will apply ice to L leg while in the ED. Pt agreed to plan.   Labs Review Labs Reviewed - No data to display  Imaging Review No results found.   EKG Interpretation None     MDM   Final diagnoses:  Leg injury    Patient with leg injury after hitting self with sledge hammer.  Plain films are negative.  Discharge to home.  RICE therapy.  I personally performed the services described in this documentation, which was scribed in my presence. The recorded information has been reviewed and is accurate.    Montine Circle, PA-C 08/22/13 0110

## 2013-08-21 NOTE — ED Notes (Signed)
Pt reports using a sledge hammer today and accidentally hitting his left lower leg with it. Having pain since. Ambulatory at triage.

## 2013-08-21 NOTE — Discharge Instructions (Signed)

## 2013-08-23 NOTE — ED Provider Notes (Signed)
Medical screening examination/treatment/procedure(s) were performed by non-physician practitioner and as supervising physician I was immediately available for consultation/collaboration.   EKG Interpretation None       Richarda Blade, MD 08/23/13 1504

## 2014-05-18 ENCOUNTER — Encounter (HOSPITAL_COMMUNITY): Payer: Self-pay | Admitting: *Deleted

## 2014-05-18 ENCOUNTER — Emergency Department (HOSPITAL_COMMUNITY)
Admission: EM | Admit: 2014-05-18 | Discharge: 2014-05-18 | Disposition: A | Discharge status: Home / Self Care | Payer: Self-pay | Attending: Emergency Medicine | Admitting: Emergency Medicine

## 2014-05-18 DIAGNOSIS — F319 Bipolar disorder, unspecified: Secondary | ICD-10-CM | POA: Insufficient documentation

## 2014-05-18 DIAGNOSIS — Y9289 Other specified places as the place of occurrence of the external cause: Secondary | ICD-10-CM | POA: Insufficient documentation

## 2014-05-18 DIAGNOSIS — Y998 Other external cause status: Secondary | ICD-10-CM | POA: Insufficient documentation

## 2014-05-18 DIAGNOSIS — X58XXXA Exposure to other specified factors, initial encounter: Secondary | ICD-10-CM | POA: Insufficient documentation

## 2014-05-18 DIAGNOSIS — Z8739 Personal history of other diseases of the musculoskeletal system and connective tissue: Secondary | ICD-10-CM | POA: Insufficient documentation

## 2014-05-18 DIAGNOSIS — E669 Obesity, unspecified: Secondary | ICD-10-CM | POA: Insufficient documentation

## 2014-05-18 DIAGNOSIS — Z79899 Other long term (current) drug therapy: Secondary | ICD-10-CM | POA: Insufficient documentation

## 2014-05-18 DIAGNOSIS — R110 Nausea: Secondary | ICD-10-CM | POA: Insufficient documentation

## 2014-05-18 DIAGNOSIS — L500 Allergic urticaria: Secondary | ICD-10-CM | POA: Insufficient documentation

## 2014-05-18 DIAGNOSIS — R062 Wheezing: Secondary | ICD-10-CM | POA: Insufficient documentation

## 2014-05-18 DIAGNOSIS — Y9389 Activity, other specified: Secondary | ICD-10-CM | POA: Insufficient documentation

## 2014-05-18 DIAGNOSIS — I10 Essential (primary) hypertension: Secondary | ICD-10-CM | POA: Insufficient documentation

## 2014-05-18 DIAGNOSIS — T7840XA Allergy, unspecified, initial encounter: Secondary | ICD-10-CM | POA: Insufficient documentation

## 2014-05-18 MED ORDER — PREDNISONE 20 MG PO TABS: 40.0000 mg | ORAL_TABLET | Freq: Every day | ORAL | Status: DC

## 2014-05-18 MED ORDER — FAMOTIDINE 20 MG PO TABS: 20.0000 mg | ORAL_TABLET | Freq: Two times a day (BID) | ORAL | Status: DC

## 2014-05-18 MED ORDER — METHYLPREDNISOLONE SODIUM SUCC 125 MG IJ SOLR
125.0000 mg | Freq: Once | INTRAMUSCULAR | Status: AC
  Administered 2014-05-18: 125 mg via INTRAVENOUS
  Filled 2014-05-18: qty 2

## 2014-05-18 MED ORDER — DIPHENHYDRAMINE HCL 50 MG/ML IJ SOLN
25.0000 mg | INTRAMUSCULAR | Status: AC
  Administered 2014-05-18: 25 mg via INTRAVENOUS
  Filled 2014-05-18: qty 1

## 2014-05-18 MED ORDER — DIPHENHYDRAMINE HCL 25 MG PO CAPS
ORAL_CAPSULE | ORAL | Status: AC
  Administered 2014-05-18: 50 mg via ORAL
  Filled 2014-05-18: qty 2

## 2014-05-18 MED ORDER — FAMOTIDINE IN NACL 20-0.9 MG/50ML-% IV SOLN
20.0000 mg | INTRAVENOUS | Status: AC
  Administered 2014-05-18: 20 mg via INTRAVENOUS
  Filled 2014-05-18: qty 50

## 2014-05-18 MED ORDER — DIPHENHYDRAMINE HCL 25 MG PO CAPS: 25.0000 mg | ORAL_CAPSULE | Freq: Four times a day (QID) | ORAL | Status: AC | PRN

## 2014-05-18 MED ORDER — ONDANSETRON HCL 4 MG/2ML IJ SOLN
4.0000 mg | INTRAMUSCULAR | Status: AC
  Administered 2014-05-18: 4 mg via INTRAVENOUS
  Filled 2014-05-18: qty 2

## 2014-05-18 MED ORDER — ALBUTEROL SULFATE (2.5 MG/3ML) 0.083% IN NEBU
5.0000 mg | INHALATION_SOLUTION | Freq: Once | RESPIRATORY_TRACT | Status: AC
  Administered 2014-05-18: 5 mg via RESPIRATORY_TRACT
  Filled 2014-05-18: qty 6

## 2014-05-18 MED ORDER — IPRATROPIUM BROMIDE 0.02 % IN SOLN
0.5000 mg | Freq: Once | RESPIRATORY_TRACT | Status: AC
  Administered 2014-05-18: 0.5 mg via RESPIRATORY_TRACT
  Filled 2014-05-18: qty 2.5

## 2014-05-18 MED ORDER — SODIUM CHLORIDE 0.9 % IV BOLUS (SEPSIS)
1000.0000 mL | INTRAVENOUS | Status: AC
  Administered 2014-05-18: 1000 mL via INTRAVENOUS

## 2014-05-18 MED ORDER — DIPHENHYDRAMINE HCL 25 MG PO CAPS: 50.0000 mg | ORAL_CAPSULE | Freq: Once | ORAL | Status: AC

## 2014-05-18 NOTE — Discharge Instructions (Signed)
Please follow the directions provided.  Be sure to follow-up at the [facility] to ensure you are getting better. Take your benedryl 25 mg by mouth every 6 hours until symptoms improve.  Take the prednisone and pepcid daily for 5 days.  Don't hesitate to return for any new, worsening or concerning symptoms.    SEEK MEDICAL CARE IF:  Open sores develop.  Redness spreads beyond area of rash.  You notice purulent (pus-like) discharge.  You have increased pain.  Other signs of infection develop (such as fever).

## 2014-05-18 NOTE — ED Notes (Signed)
Per EDP - okay for pt to eat/drink.

## 2014-05-18 NOTE — ED Provider Notes (Signed)
CSN: [id number]     Arrival date & time 05/18/14  1905 History   First MD Initiated Contact with Patient 05/18/14 2050     Chief Complaint  Patient presents with  . Rash   (Consider location/radiation/quality/duration/timing/severity/associated sxs/prior Treatment) HPI  Juan Grimes is a 29 yo male presenting with urticarial rash onset this am.  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 The rash has progressed up his legs and is also around his neck and belt line.  He rates the pain 8/10.  He  reports some nausea but denies shortness of breath, facial involvement or oral swelling.  Past Medical History  Diagnosis Date  . Bipolar 1 disorder, manic, moderate   . Seizure     reported massive seizure in 2008  . Plantar fasciitis   . HTN (hypertension)     normal renin 2008  . Obese    Past Surgical History  Procedure Laterality Date  . Tonsilectomy, adenoidectomy, bilateral myringotomy and tubes      1996   Family History  Problem Relation Age of Onset  . Bipolar disorder Father   . Hypertension Father   . Fibromyalgia Mother    History  Substance Use Topics  . Smoking status: Never Smoker   . Smokeless tobacco: Current User    Types: Snuff  . Alcohol Use: 1.0 oz/week    2 drink(s) per week    Review of Systems  Constitutional: Negative for fever and chills.  HENT: Negative for sore throat.   Eyes: Negative for visual disturbance.  Respiratory: Negative for cough and shortness of breath.   Cardiovascular: Negative for chest pain and leg swelling.  Gastrointestinal: Positive for nausea. Negative for vomiting and diarrhea.  Genitourinary: Negative for dysuria.  Musculoskeletal: Negative for myalgias.  Skin: Positive for rash.  Neurological: Negative for weakness, numbness and headaches.     Allergies  Review of patient's allergies  indicates no known allergies.  Home Medications   Prior to Admission medications   Medication Sig Start Date End Date Taking? Authorizing Provider  hydrochlorothiazide (HYDRODIURIL) 25 MG tablet Take 1 tablet (25 mg total) by mouth daily. 07/10/13   Marin Olp, MD  HYDROcodone-acetaminophen (NORCO/VICODIN) 5-325 MG per tablet Take 1-2 tablets by mouth every 6 (six) hours as needed. 08/21/13   Montine Circle, PA-C  lisinopril (PRINIVIL,ZESTRIL) 10 MG tablet Take 1 tablet (10 mg total) by mouth daily. 07/10/13 10/13/14  Marin Olp, MD  LORazepam (ATIVAN) 1 MG tablet Take 2 mg by mouth every 6 (six) hours as needed for anxiety.    Historical Provider, MD  OVER THE COUNTER MEDICATION Take 3 tablets by mouth daily. Dietary supplement    Historical Provider, MD  QUEtiapine (SEROQUEL XR) 200 MG 24 hr tablet Take 200 mg by mouth at bedtime.    Historical Provider, MD   BP 133/84 mmHg  Pulse 122  Temp(Src) 99.8 F (37.7 C) (Oral)  Resp 20  Ht 6' (1.829 m)  Wt 335 lb 14.4 oz (152.363 kg)  BMI 45.55 kg/m2  SpO2 100% Physical Exam  Constitutional: He appears well-developed and well-nourished. No distress.  HENT:  Head: Normocephalic and atraumatic.  Mouth/Throat: Oropharynx is clear and moist. No oropharyngeal exudate.  Eyes: Conjunctivae are normal.  Neck: Neck supple. No thyromegaly present.  Cardiovascular: Normal rate, regular rhythm and  intact distal pulses.   Pulmonary/Chest: Effort normal. No respiratory distress. He has wheezes ( Mild) in the right middle field and the left middle field. He has no rales. He exhibits no tenderness.  Abdominal: Soft. There is no tenderness.  Musculoskeletal: He exhibits no tenderness.  Lymphadenopathy:    He has no cervical adenopathy.  Neurological: He is alert.  Skin: Skin is warm and dry. Rash noted. Rash is urticarial. He is not diaphoretic.  Urticarial rash on bilat arms, legs, trunk, sparing his face and genitals.  Psychiatric: He has a  normal mood and affect.  Nursing note and vitals reviewed.   ED Course  Procedures (including critical care time) Labs Review Labs Reviewed - No data to display  Imaging Review No results found.   EKG Interpretation None      MDM   Final diagnoses:  Allergic reaction, initial encounter   29 yo with diffuse urticaria, pt has been outside in a wooded area.  Pt denies any shortness of breath or oral swelling and his O2 sats are 100%, however he reports some nuases and has some wheezing on exam. Neb tx given, IV benedryl, solumedrol and pepcid given. Pt monitored in the ED with no worsening of rash and improvement in lung exam and nausea resolved.  Patient re-evaluated prior to dc, is hemodynamically stable, in no respiratory distress, and denies the feeling of throat closing. Pt has been advised to take benadryl, prednisone and pepcid to help with rash. Return precautions provided including signs mod-severe allergic rxn (s/s including throat closing, difficulty breathing, swelling of lips face or tongue). Pt is to follow up with their PCP. Pt is agreeable with plan & verbalizes understanding.   Filed Vitals:   05/18/14 1950 05/18/14 2318  BP: 133/84 136/77  Pulse: 122 112  Temp: 99.8 F (37.7 C)   TempSrc: Oral   Resp: 20 16  Height: 6' (1.829 m)   Weight: 335 lb 14.4 oz (152.363 kg)   SpO2: 100% 100%   Meds given in ED:  Medications  diphenhydrAMINE (BENADRYL) capsule 50 mg (50 mg Oral Given 05/18/14 1957)  sodium chloride 0.9 % bolus 1,000 mL (0 mLs Intravenous Stopped 05/18/14 2317)  diphenhydrAMINE (BENADRYL) injection 25 mg (25 mg Intravenous Given 05/18/14 2141)  methylPREDNISolone sodium succinate (SOLU-MEDROL) 125 mg/2 mL injection 125 mg (125 mg Intravenous Given 05/18/14 2141)  famotidine (PEPCID) IVPB 20 mg (0 mg Intravenous Stopped 05/18/14 2211)  albuterol (PROVENTIL) (2.5 MG/3ML) 0.083% nebulizer solution 5 mg (5 mg Nebulization Given 05/18/14 2138)  ipratropium  (ATROVENT) nebulizer solution 0.5 mg (0.5 mg Nebulization Given 05/18/14 2138)  ondansetron (ZOFRAN) injection 4 mg (4 mg Intravenous Given 05/18/14 2141)    Discharge Medication List as of 05/18/2014 11:06 PM    START taking these medications   Details  diphenhydrAMINE (BENADRYL) 25 mg capsule Take 1 capsule (25 mg total) by mouth every 6 (six) hours as needed., Starting 05/18/2014, Until Discontinued, Print    famotidine (PEPCID) 20 MG tablet Take 1 tablet (20 mg total) by mouth 2 (two) times daily., Starting 05/18/2014, Until Discontinued, Print    predniSONE (DELTASONE) 20 MG tablet Take 2 tablets (40 mg total) by mouth daily., Starting 05/18/2014, Until Discontinued, Print           Britt Bottom, NP 05/20/14 2115  Serita Grit, MD 05/22/14 [phone]

## 2014-05-18 NOTE — ED Notes (Signed)
Patient presents with rash on his arms and legs.  States the pain is bad but denies SOB  Denies any new meds, clothing, food, soaps

## 2014-05-22 ENCOUNTER — Encounter: Payer: Self-pay | Admitting: Family Medicine

## 2014-05-22 ENCOUNTER — Ambulatory Visit (INDEPENDENT_AMBULATORY_CARE_PROVIDER_SITE_OTHER): Payer: PRIVATE HEALTH INSURANCE | Admitting: Family Medicine

## 2014-05-22 VITALS — BP 114/84 | HR 105 | Temp 98.7°F | Wt 331.0 lb

## 2014-05-22 DIAGNOSIS — H6091 Unspecified otitis externa, right ear: Secondary | ICD-10-CM

## 2014-05-22 DIAGNOSIS — H6090 Unspecified otitis externa, unspecified ear: Secondary | ICD-10-CM | POA: Insufficient documentation

## 2014-05-22 MED ORDER — AMOXICILLIN 500 MG PO CAPS: 500.0000 mg | ORAL_CAPSULE | Freq: Three times a day (TID) | ORAL | Status: AC

## 2014-05-22 MED ORDER — CIPROFLOXACIN-DEXAMETHASONE 0.3-0.1 % OT SUSP: 4.0000 [drp] | Freq: Two times a day (BID) | OTIC | Status: AC

## 2014-05-22 NOTE — Patient Instructions (Signed)
Thank you for coming to the clinic today. It was nice seeing you.  For your ear, we have prescribed you antibiotic drops and an oral antibiotic.  Please roll up a soft piece of tissue paper and place it in your left ear prior to placing the ear drops. You should use the ear drops for a full 7 days, even if you feel better.  We have also prescribed you amoxicillin. You should take this medication three times a day for 7 days. Please finish the entire course of antibiotics.  I would like to see you in the clinic again in 1-2 weeks. If your symptoms are not improving within the next few days, please call to schedule an appointment sooner. Also please seek medical care sooner if you develop sudden sharp pain that isnt responsive to medications or any facial weakness or numbness.

## 2014-05-22 NOTE — Progress Notes (Signed)
Juan Grimes is a 29 y.o. male who presents to the [facility] today with a chief complaint of ear drainage.   HPI:  Ear Drainage Patient reports that we woke up with a rash on 4 days ago. Rash was spread raised and spread diffusely over his body. States that he was playing horseshoes the day prior but had not other exposures. Went to the ED and was given steroids, benadryl, and pepcid. Rash was not pruritic, only painful Rash has subsided gradually over the past several days. 3 days ago noted severe abdominal pain. Pain lasted all day and was sudden in onset. Did not have any nausea or vomiting with the pain. No constipation or diarrhea. No dysuria or dark colored urine. Pain gradually went away over the past 2 days.   3 days ago also states that he heard a sudden pop in his right ear and then noted blood coming from his ear. States that his ear was hurting prior to this. Discharge was initally dark red, but then transitioned to lighter red. Bleeding has slowed down over the past few days, but has also recently started noticing pus-like discharge from the right ear.  Is currently experiencing ear pain and states that he cannot hear anything out of his right ear. Has not been swimming recently. No trauma to ears. Has not scratched ears. No sore throat. Endorses some mild chest congestion, otherwise no URI symptoms. No fevers or chills.   ROS: As per HPI, otherwise all systems reviewed and are negative.  Past Medical History - Reviewed and updated Patient Active Problem List   Diagnosis Date Noted  . Otitis externa 05/22/2014  . OSA on CPAP 11/25/2010  . History of Seizure 11/25/2010  . HTN (hypertension) 11/15/2010  . Obesity, morbid (BMI > 40) 11/15/2010  . Knee pain after work (morbid obesity and on feet) 11/15/2010  . Bipolar I disorder 11/15/2010    Medications- reviewed and updated Current Outpatient Prescriptions  Medication Sig Dispense Refill  . amoxicillin (AMOXIL) 500 MG capsule  Take 1 capsule (500 mg total) by mouth 3 (three) times daily. 21 capsule 0  . ciprofloxacin-dexamethasone (CIPRODEX) otic suspension Place 4 drops into the right ear 2 (two) times daily. 7.5 mL 0  . diphenhydrAMINE (BENADRYL) 25 mg capsule Take 1 capsule (25 mg total) by mouth every 6 (six) hours as needed. 30 capsule 0  . famotidine (PEPCID) 20 MG tablet Take 1 tablet (20 mg total) by mouth 2 (two) times daily. 5 tablet 0  . hydrochlorothiazide (HYDRODIURIL) 25 MG tablet Take 1 tablet (25 mg total) by mouth daily. 90 tablet 3  . HYDROcodone-acetaminophen (NORCO/VICODIN) 5-325 MG per tablet Take 1-2 tablets by mouth every 6 (six) hours as needed. 5 tablet 0  . lisinopril (PRINIVIL,ZESTRIL) 10 MG tablet Take 1 tablet (10 mg total) by mouth daily. 90 tablet 3  . LORazepam (ATIVAN) 1 MG tablet Take 2 mg by mouth every 6 (six) hours as needed for anxiety.    Marland Kitchen OVER THE COUNTER MEDICATION Take 3 tablets by mouth daily. Dietary supplement    . predniSONE (DELTASONE) 20 MG tablet Take 2 tablets (40 mg total) by mouth daily. 10 tablet 0  . QUEtiapine (SEROQUEL XR) 200 MG 24 hr tablet Take 200 mg by mouth at bedtime.     No current facility-administered medications for this visit.    Objective: Physical Exam: BP 114/84 mmHg  Pulse 105  Temp(Src) 98.7 F (37.1 C) (Oral)  Wt 331 lb (150.141  kg)  Gen: NAD, resting comfortably HEENT: Left TM erythematous with no drainage. Right ear canal with significant about of white discharge. Right TM difficult to visualize given amount of material in ear canal, however appears to have small perforation.  CV: RRR with no murmurs appreciated Lungs: NWOB, CTAB with no crackles, wheezes, or rhonchi Abdomen: Normal bowel sounds present. Soft, Nontender, Nondistended. Ext: no edema Skin: warm, dry Neuro: grossly normal, moves all extremities  A/P: See problem list  Otitis externa Unclear unifying etiology of diffuse, nonprurutic, paintul, papular rash, abdominal  pain, and ear infection. Can consider HSP, but not consistent with rash, ear infection, age of patient and lack of discolored urine.  Possibly viral etiology leading to eustation tube dysfunction but has no other URI symptoms.  Will treat with 1 week course of ciprodex drops and oral amoxicillin. Will return in 1-2 weeks for follow up or sooner if symptoms not improving.      Meds ordered this encounter  Medications  . ciprofloxacin-dexamethasone (CIPRODEX) otic suspension    Sig: Place 4 drops into the right ear 2 (two) times daily.    Dispense:  7.5 mL    Refill:  0  . amoxicillin (AMOXIL) 500 MG capsule    Sig: Take 1 capsule (500 mg total) by mouth 3 (three) times daily.    Dispense:  21 capsule    Refill:  0     Wm Sahagun M. Jerline Pain, [place] Resident PGY-1 05/22/2014 6:09 PM

## 2014-05-22 NOTE — Assessment & Plan Note (Addendum)
Unclear unifying etiology of diffuse, nonprurutic, paintul, papular rash, abdominal pain, and ear infection. Can consider HSP, but not consistent with rash, ear infection, age of patient and lack of discolored urine.  Possibly viral etiology leading to eustation tube dysfunction but has no other URI symptoms.  Will treat with 1 week course of ciprodex drops and oral amoxicillin. Will return in 1-2 weeks for follow up or sooner if symptoms not improving.

## 2014-06-20 ENCOUNTER — Ambulatory Visit: Payer: PRIVATE HEALTH INSURANCE | Admitting: Family Medicine

## 2014-07-10 ENCOUNTER — Ambulatory Visit (INDEPENDENT_AMBULATORY_CARE_PROVIDER_SITE_OTHER): Payer: PRIVATE HEALTH INSURANCE | Admitting: Family Medicine

## 2014-07-10 ENCOUNTER — Encounter: Payer: Self-pay | Admitting: Family Medicine

## 2014-07-10 VITALS — BP 149/83 | HR 107 | Temp 98.2°F | Ht 70.0 in | Wt 330.4 lb

## 2014-07-10 DIAGNOSIS — I10 Essential (primary) hypertension: Secondary | ICD-10-CM

## 2014-07-10 DIAGNOSIS — R32 Unspecified urinary incontinence: Secondary | ICD-10-CM | POA: Diagnosis not present

## 2014-07-10 DIAGNOSIS — R21 Rash and other nonspecific skin eruption: Secondary | ICD-10-CM | POA: Insufficient documentation

## 2014-07-10 DIAGNOSIS — H6091 Unspecified otitis externa, right ear: Secondary | ICD-10-CM | POA: Diagnosis not present

## 2014-07-10 LAB — POCT URINALYSIS DIPSTICK
BILIRUBIN UA: NEGATIVE
GLUCOSE UA: NEGATIVE
KETONES UA: NEGATIVE
Leukocytes, UA: NEGATIVE
NITRITE UA: NEGATIVE
RBC UA: NEGATIVE
SPEC GRAV UA: 1.025
Urobilinogen, UA: 1
pH, UA: 6

## 2014-07-10 MED ORDER — PREDNISONE 50 MG PO TABS: 50.0000 mg | ORAL_TABLET | Freq: Every day | ORAL | Status: DC

## 2014-07-10 MED ORDER — LISINOPRIL 10 MG PO TABS: 10.0000 mg | ORAL_TABLET | Freq: Every day | ORAL | Status: DC

## 2014-07-10 MED ORDER — TRAMADOL HCL 50 MG PO TABS: 50.0000 mg | ORAL_TABLET | Freq: Three times a day (TID) | ORAL | Status: DC | PRN

## 2014-07-10 NOTE — Assessment & Plan Note (Signed)
Resolved. No current issues.

## 2014-07-10 NOTE — Patient Instructions (Addendum)
Thank you for coming to the clinic today. It was nice seeing you.  For your rash, this is likely a condition called urticaria. It is usually due to an allergic reaction. We will give you a course of prednisone. We will also refer you to an allergist for allergy testing.  We will also check your urine today for a urinary tract infection  Please come back in 3-6 months or sooner if you need anything.

## 2014-07-10 NOTE — Assessment & Plan Note (Signed)
Appears to be urticaria though patient has no new known exposures. Will treat with course of prednisone and refer for allergy testing. Instructed patient to try to look for triggers. Return precautions given. Consider dermatology referral if continues to recur.

## 2014-07-10 NOTE — Progress Notes (Signed)
Juan Grimes is a 29 y.o. male who presents to the [facility] today with a chief complaint of rash. His concerns today include:  HPI:  Rash Diffuse, generalized rash for the past week. Started on left arm then spread to trunk and rest of body. Had similar rash 2 months ago. Was seen in the ED and given course of steroids which resolved th rash. No new exposures, detergents, or soaps. Rash is not itchy. Reports diffuse pain associated with the rash. No shortness of breath or chest pain.  Ear Infection Was unable to afford ear drops. Took full course of oral antibiotic. Ear pain resolved. No drainage.   Urinary incontinence States that he has been unable to make it to the bathroom before wetting himself. Has been occuring States that he has the urge to void, but it comes on suddenly and he cannot make it to the restroom in time. No dysuria or increased frequency. No fevers or chills.   ROS: As per HPI, otherwise all systems reviewed and are negative.  Past Medical History - Reviewed and updated Patient Active Problem List   Diagnosis Date Noted  . Rash and nonspecific skin eruption 07/10/2014  . Urinary incontinence 07/10/2014  . Otitis externa 05/22/2014  . OSA on CPAP 11/25/2010  . History of Seizure 11/25/2010  . HTN (hypertension) 11/15/2010  . Obesity, morbid (BMI > 40) 11/15/2010  . Knee pain after work (morbid obesity and on feet) 11/15/2010  . Bipolar I disorder 11/15/2010    Medications- reviewed and updated Current Outpatient Prescriptions  Medication Sig Dispense Refill  . diphenhydrAMINE (BENADRYL) 25 mg capsule Take 1 capsule (25 mg total) by mouth every 6 (six) hours as needed. 30 capsule 0  . famotidine (PEPCID) 20 MG tablet Take 1 tablet (20 mg total) by mouth 2 (two) times daily. 5 tablet 0  . hydrochlorothiazide (HYDRODIURIL) 25 MG tablet Take 1 tablet (25 mg total) by mouth daily. 90 tablet 3  . HYDROcodone-acetaminophen (NORCO/VICODIN) 5-325 MG per tablet Take  1-2 tablets by mouth every 6 (six) hours as needed. 5 tablet 0  . lisinopril (PRINIVIL,ZESTRIL) 10 MG tablet Take 1 tablet (10 mg total) by mouth daily. 90 tablet 3  . LORazepam (ATIVAN) 1 MG tablet Take 2 mg by mouth every 6 (six) hours as needed for anxiety.    Marland Kitchen OVER THE COUNTER MEDICATION Take 3 tablets by mouth daily. Dietary supplement    . predniSONE (DELTASONE) 50 MG tablet Take 1 tablet (50 mg total) by mouth daily with breakfast. 5 tablet 0  . QUEtiapine (SEROQUEL XR) 200 MG 24 hr tablet Take 200 mg by mouth at bedtime.    . traMADol (ULTRAM) 50 MG tablet Take 1 tablet (50 mg total) by mouth every 8 (eight) hours as needed. 20 tablet 0   No current facility-administered medications for this visit.    Objective: Physical Exam: BP 149/83 mmHg  Pulse 107  Temp(Src) 98.2 F (36.8 C) (Oral)  Ht 5\' 10"  (1.778 m)  Wt 330 lb 6 oz (149.857 kg)  BMI 47.40 kg/m2  Gen: NAD, resting comfortably HEENT: TMs clear bilaterally CV: RRR with no murmurs appreciated Lungs: NWOB, CTAB with no crackles, wheezes, or rhonchi Abdomen: Normal bowel sounds present. Soft, Nontender, Nondistended. Ext: no edema Skin: Diffuse patchy rash. Lesions are pink, slightly raised, oval shaped, and approximately 4cm x 2 cm in diameter. Located on arms and trunk. No scaling. Blanchable.  Neuro: grossly normal, moves all extremities  Results for orders  placed or performed in visit on 07/10/14 (from the past 72 hour(s))  POCT urinalysis dipstick     Status: None   Collection Time: 07/10/14  5:01 PM  Result Value Ref Range   Color, UA YELLOW    Clarity, UA CLEAR    Glucose, UA NEG    Bilirubin, UA NEG    Ketones, UA NEG    Spec Grav, UA 1.025    Blood, UA NEG    pH, UA 6.0    Protein, UA TRACE    Urobilinogen, UA 1.0    Nitrite, UA NEG    Leukocytes, UA Negative     A/P: See problem list  Rash and nonspecific skin eruption Appears to be urticaria though patient has no new known exposures. Will treat  with course of prednisone and refer for allergy testing. Instructed patient to try to look for triggers. Return precautions given. Consider dermatology referral if continues to recur.    Otitis externa Resolved. No current issues.    Urinary incontinence Seems to be urge-type. UA negative for signs of infection. Consider A1c at next visit. If not improving, consider referral to urology.      Orders Placed This Encounter  Procedures  . Ambulatory referral to Allergy    Referral Priority:  Routine    Referral Type:  Allergy Testing    Referral Reason:  Specialty Services Required    Requested Specialty:  Allergy    Number of Visits Requested:  1  . POCT urinalysis dipstick    Meds ordered this encounter  Medications  . lisinopril (PRINIVIL,ZESTRIL) 10 MG tablet    Sig: Take 1 tablet (10 mg total) by mouth daily.    Dispense:  90 tablet    Refill:  3  . predniSONE (DELTASONE) 50 MG tablet    Sig: Take 1 tablet (50 mg total) by mouth daily with breakfast.    Dispense:  5 tablet    Refill:  0  . traMADol (ULTRAM) 50 MG tablet    Sig: Take 1 tablet (50 mg total) by mouth every 8 (eight) hours as needed.    Dispense:  20 tablet    Refill:  0     Juan Grimes M. Jerline Pain, [place] Resident PGY-1 07/10/2014 5:37 PM

## 2014-07-10 NOTE — Assessment & Plan Note (Signed)
Seems to be urge-type. UA negative for signs of infection. Consider A1c at next visit. If not improving, consider referral to urology.

## 2014-07-11 ENCOUNTER — Encounter: Payer: Self-pay | Admitting: Family Medicine

## 2015-05-09 ENCOUNTER — Other Ambulatory Visit: Payer: Self-pay | Admitting: Family Medicine

## 2015-08-07 ENCOUNTER — Other Ambulatory Visit: Payer: Self-pay | Admitting: Family Medicine

## 2015-08-07 NOTE — Telephone Encounter (Signed)
Rx filled. Patient needs appointment for further refills.  Algis Greenhouse. Jerline Pain, [place] [facility] Resident PGY-2 08/07/2015 10:29 AM

## 2015-08-07 NOTE — Telephone Encounter (Signed)
Lm for patient on identified VM asking patient to call back and schedule an appt to follow up with his provider.  He needs an appt to follow up on his blood pressure when he calls back. Lorraine Terriquez,CMA

## 2015-10-14 ENCOUNTER — Other Ambulatory Visit: Payer: Self-pay | Admitting: Family Medicine

## 2015-10-15 NOTE — Telephone Encounter (Signed)
Called pt and his mother answered and I gave her the information below.  She said she would let pt know and would have him schedule and appointment. Katharina Caper, April D, [place]

## 2015-10-15 NOTE — Telephone Encounter (Signed)
Dr Marigene Ehlers 08/2015 telephone note states that patient MUST have an appt for further refills.  Please have patient schedule.  I will send in a 2 week supply until patient is seen.

## 2016-01-19 ENCOUNTER — Encounter: Payer: Self-pay | Admitting: Family Medicine

## 2016-01-19 ENCOUNTER — Ambulatory Visit (INDEPENDENT_AMBULATORY_CARE_PROVIDER_SITE_OTHER): Payer: 59 | Admitting: Family Medicine

## 2016-01-19 VITALS — BP 142/80 | HR 96 | Temp 98.4°F | Wt 343.0 lb

## 2016-01-19 DIAGNOSIS — I10 Essential (primary) hypertension: Secondary | ICD-10-CM | POA: Diagnosis not present

## 2016-01-19 DIAGNOSIS — Z79899 Other long term (current) drug therapy: Secondary | ICD-10-CM

## 2016-01-19 DIAGNOSIS — M25561 Pain in right knee: Secondary | ICD-10-CM | POA: Diagnosis not present

## 2016-01-19 LAB — LIPID PANEL
Cholesterol: 122 mg/dL (ref <200)
HDL: 30 mg/dL — AB (ref >40)
LDL CALC: 77 mg/dL (ref <100)
TRIGLYCERIDES: 73 mg/dL (ref <150)
Total CHOL/HDL Ratio: 4.1 Ratio (ref <5.0)
VLDL: 15 mg/dL (ref <30)

## 2016-01-19 LAB — COMPLETE METABOLIC PANEL WITH GFR
ALBUMIN: 4.2 g/dL (ref 3.6–5.1)
ALK PHOS: 53 U/L (ref 40–115)
ALT: 24 U/L (ref 9–46)
AST: 16 U/L (ref 10–40)
BILIRUBIN TOTAL: 0.6 mg/dL (ref 0.2–1.2)
BUN: 12 mg/dL (ref 7–25)
CO2: 26 mmol/L (ref 20–31)
CREATININE: 0.84 mg/dL (ref 0.60–1.35)
Calcium: 8.7 mg/dL (ref 8.6–10.3)
Chloride: 105 mmol/L (ref 98–110)
Glucose, Bld: 129 mg/dL — ABNORMAL HIGH (ref 65–99)
Potassium: 3.8 mmol/L (ref 3.5–5.3)
Sodium: 140 mmol/L (ref 135–146)
TOTAL PROTEIN: 6.5 g/dL (ref 6.1–8.1)

## 2016-01-19 LAB — CBC
HEMATOCRIT: 41.1 % (ref 38.5–50.0)
HEMOGLOBIN: 13.7 g/dL (ref 13.2–17.1)
MCH: 26.4 pg — AB (ref 27.0–33.0)
MCHC: 33.3 g/dL (ref 32.0–36.0)
MCV: 79.3 fL — AB (ref 80.0–100.0)
MPV: 10 fL (ref 7.5–12.5)
PLATELETS: 167 10*3/uL (ref 140–400)
RBC: 5.18 MIL/uL (ref 4.20–5.80)
RDW: 15.9 % — ABNORMAL HIGH (ref 11.0–15.0)
WBC: 6.5 10*3/uL (ref 3.8–10.8)

## 2016-01-19 MED ORDER — MELOXICAM 15 MG PO TABS: 15.0000 mg | ORAL_TABLET | Freq: Every day | ORAL | 0 refills | Status: DC

## 2016-01-19 MED ORDER — LISINOPRIL 10 MG PO TABS: 10.0000 mg | ORAL_TABLET | Freq: Every day | ORAL | 11 refills | Status: DC

## 2016-01-19 NOTE — Progress Notes (Signed)
    Subjective:  Juan Grimes is a 30 y.o. male who presents to the [facility] today with a chief complaint of knee pain.   HPI:  Hypertension BP Readings from Last 3 Encounters:  01/19/16 (!) 142/80  07/10/14 (!) 149/83  05/22/14 114/84   Home BP monitoring-yes Compliant with medications-yes, without side effects though has not been taking lisinopril ROS-Denies any CP, HA, SOB, blurry vision, LE edema, transient weakness, orthopnea, PND.   Knee Pain Located in right knee. Started about 1.5 months ago. Mostly located on the medial aspect. No known obvious trauma or precipitating events. Pain is there all the time. Worse with movement and weight bearing. Has tried several medications that have not helped. No obvious swelling. Occasionally has locking and popping. Has felt knee buckle a couple of times.   ROS: Per HPI  PMH: Smoking history reviewed.    Objective:  Physical Exam: BP (!) 142/80   Pulse 96   Temp 98.4 F (36.9 C) (Oral)   Wt (!) 343 lb (155.6 kg)   SpO2 97%   BMI 49.22 kg/m   Gen: NAD, resting comfortably CV: RRR with no murmurs appreciated Pulm: NWOB, CTAB with no crackles, wheezes, or rhonchi GI: Obese, Normal bowel sounds present. Soft, Nontender, Nondistended. MSK:  -Right knee: No deformities. No effusions noted. FROM. Tender to palpation along medial joint line. Stable to varus and valgus stress. Anterior and posterior drawer negative. Positive Thessaly's.  Skin: warm, dry Neuro: grossly normal, moves all extremities Psych: Normal affect and thought content  Assessment/Plan:  HTN (hypertension) Slightly above goal today. Continue HCTZ. Restart lisinopril. Check BMP today and repeat in 1 week.   Right knee pain Concern for medial meniscus tear given pain on palpation of medial joint line and positive thessaly's test. Will obtain plain film to rule out degenerative changes. Will treat with mobic. Discussed possibility of injection vs surgery. Patient  elected to defer both at this point. If plain film negative, would consider MRI for definitive diagnosis. Follow up in 1 month.   Algis Greenhouse. Jerline Pain, [place] [facility] Resident PGY-3 01/19/2016 4:51 PM

## 2016-01-19 NOTE — Assessment & Plan Note (Signed)
Slightly above goal today. Continue HCTZ. Restart lisinopril. Check BMP today and repeat in 1 week.

## 2016-01-19 NOTE — Assessment & Plan Note (Addendum)
Concern for medial meniscus tear given pain on palpation of medial joint line and positive thessaly's test. Will obtain plain film to rule out degenerative changes. Will treat with mobic. Discussed possibility of injection vs surgery. Patient elected to defer both at this point. If plain film negative, would consider MRI for definitive diagnosis. Follow up in 1 month.

## 2016-01-19 NOTE — Patient Instructions (Addendum)
I am worried that you may have a mensical tear.  We will get an xray to make sure there isnt anything else going on.  We will treat with mobic once a day.  Your blood pressure is still a little high, we will restart lisinopril today. Come back in 1 week to recheck blood work.   Come back to see me in 1-3 months, or sooner if you have any questions.  Take care,  Dr Jerline Pain

## 2016-01-20 ENCOUNTER — Encounter: Payer: Self-pay | Admitting: Family Medicine

## 2016-02-02 ENCOUNTER — Ambulatory Visit (INDEPENDENT_AMBULATORY_CARE_PROVIDER_SITE_OTHER): Payer: 59 | Admitting: Family Medicine

## 2016-02-02 ENCOUNTER — Encounter: Payer: Self-pay | Admitting: Family Medicine

## 2016-02-02 VITALS — BP 124/82 | HR 97 | Temp 99.2°F | Wt 343.0 lb

## 2016-02-02 DIAGNOSIS — I10 Essential (primary) hypertension: Secondary | ICD-10-CM | POA: Diagnosis not present

## 2016-02-02 DIAGNOSIS — W540XXA Bitten by dog, initial encounter: Secondary | ICD-10-CM | POA: Diagnosis not present

## 2016-02-02 DIAGNOSIS — R739 Hyperglycemia, unspecified: Secondary | ICD-10-CM | POA: Diagnosis not present

## 2016-02-02 LAB — POCT GLYCOSYLATED HEMOGLOBIN (HGB A1C): Hemoglobin A1C: 5.1

## 2016-02-02 MED ORDER — HYDROCHLOROTHIAZIDE 25 MG PO TABS: 25.0000 mg | ORAL_TABLET | Freq: Every day | ORAL | 3 refills | Status: DC

## 2016-02-02 NOTE — Assessment & Plan Note (Signed)
At goal on HCTZ and lisinopril. Will continue. Patient deferred BMP today. Stated that he would come back later this week to have it drawn. Discussed potential for electrolyte abnormalities since we recently started lisinopril, however patient still deferred.

## 2016-02-02 NOTE — Progress Notes (Signed)
    Subjective:  Juan Grimes is a 30 y.o. male who presents to the [facility] today with a chief complaint of HTN.   HPI:  Hypertension BP Readings from Last 3 Encounters:  02/02/16 124/82  01/19/16 (!) 142/80  07/10/14 (!) 149/83   Home BP monitoring-No Compliant with medications-yes, without side effects ROS-Denies any CP, HA, SOB, blurry vision, LE edema, transient weakness, orthopnea, PND.   Dog Bite Yesterday broke up a dog fight and got bit on right arm. Since this has had pain and blood drainage. No spreading erythema. No purulent discharge.    ROS: Per HPI  PMH: Smoking history reviewed.    Objective:  Physical Exam: BP 124/82   Pulse 97   Temp 99.2 F (37.3 C) (Oral)   Wt (!) 343 lb (155.6 kg)   SpO2 96%   BMI 49.22 kg/m   Gen: NAD, resting comfortably CV: RRR with no murmurs appreciated Pulm: NWOB, CTAB with no crackles, wheezes, or rhonchi GI: Normal bowel sounds present. Soft, Nontender, Nondistended. MSK:  - Right Arm: Several small 1-2cm lacerations noted on dorsal aspect of right forearm with dried blood. No surrounding erythema. No purulence.  Skin: warm, dry Neuro: grossly normal, moves all extremities Psych: Normal affect and thought content  Results for orders placed or performed in visit on 02/02/16 (from the past 72 hour(s))  HgB A1c     Status: None   Collection Time: 02/02/16  3:50 PM  Result Value Ref Range   Hemoglobin A1C 5.1    Assessment/Plan:  HTN (hypertension) At goal on HCTZ and lisinopril. Will continue. Patient deferred BMP today. Stated that he would come back later this week to have it drawn. Discussed potential for electrolyte abnormalities since we recently started lisinopril, however patient still deferred.   Dog Bite Does not appear infected. Appears to have only superficial involvement - does not appear to involve underlying soft tissue. No need for prophylactic antibiotics. Strict return precautions reviewed.   Algis Greenhouse.  Jerline Pain, [place] [facility] Resident PGY-3 02/02/2016 4:28 PM

## 2016-02-02 NOTE — Patient Instructions (Signed)
Your blood pressure looks great today. We will check blood work to make sure your electrolytes are stable.  Your arm looks ok. Please keep an eye out for any pus or spreading redness. If this happens, please let us know.  Come back to see me in 3-6 months, or sooner if you need anything else.  Take care,  Dr Jerline Pain

## 2016-05-12 ENCOUNTER — Emergency Department (HOSPITAL_COMMUNITY)
Admission: EM | Admit: 2016-05-12 | Discharge: 2016-05-12 | Disposition: A | Discharge status: Home / Self Care | Payer: Managed Care, Other (non HMO) | Attending: Emergency Medicine | Admitting: Emergency Medicine

## 2016-05-12 ENCOUNTER — Encounter (HOSPITAL_COMMUNITY): Payer: Self-pay | Admitting: Emergency Medicine

## 2016-05-12 ENCOUNTER — Emergency Department (HOSPITAL_COMMUNITY): Payer: Managed Care, Other (non HMO)

## 2016-05-12 DIAGNOSIS — Z79899 Other long term (current) drug therapy: Secondary | ICD-10-CM | POA: Diagnosis not present

## 2016-05-12 DIAGNOSIS — R1013 Epigastric pain: Secondary | ICD-10-CM | POA: Diagnosis not present

## 2016-05-12 DIAGNOSIS — R1010 Upper abdominal pain, unspecified: Secondary | ICD-10-CM | POA: Diagnosis present

## 2016-05-12 DIAGNOSIS — R109 Unspecified abdominal pain: Secondary | ICD-10-CM

## 2016-05-12 DIAGNOSIS — I10 Essential (primary) hypertension: Secondary | ICD-10-CM | POA: Diagnosis not present

## 2016-05-12 LAB — COMPREHENSIVE METABOLIC PANEL
ALBUMIN: 4.1 g/dL (ref 3.5–5.0)
ALK PHOS: 49 U/L (ref 38–126)
ALT: 31 U/L (ref 17–63)
ANION GAP: 12 (ref 5–15)
AST: 23 U/L (ref 15–41)
BILIRUBIN TOTAL: 0.9 mg/dL (ref 0.3–1.2)
BUN: 17 mg/dL (ref 6–20)
CALCIUM: 8.8 mg/dL — AB (ref 8.9–10.3)
CO2: 27 mmol/L (ref 22–32)
Chloride: 98 mmol/L — ABNORMAL LOW (ref 101–111)
Creatinine, Ser: 0.88 mg/dL (ref 0.61–1.24)
GFR calc Af Amer: >60 mL/min (ref >60)
GFR calc non Af Amer: >60 mL/min (ref >60)
GLUCOSE: 116 mg/dL — AB (ref 65–99)
Potassium: 3.4 mmol/L — ABNORMAL LOW (ref 3.5–5.1)
Sodium: 137 mmol/L (ref 135–145)
TOTAL PROTEIN: 7 g/dL (ref 6.5–8.1)

## 2016-05-12 LAB — CBC WITH DIFFERENTIAL/PLATELET
BASOS ABS: 0 10*3/uL (ref 0.0–0.1)
BASOS PCT: 0 %
Eosinophils Absolute: 0.5 10*3/uL (ref 0.0–0.7)
Eosinophils Relative: 4 %
HEMATOCRIT: 45.7 % (ref 39.0–52.0)
HEMOGLOBIN: 15.4 g/dL (ref 13.0–17.0)
LYMPHS PCT: 15 %
Lymphs Abs: 1.7 10*3/uL (ref 0.7–4.0)
MCH: 27 pg (ref 26.0–34.0)
MCHC: 33.7 g/dL (ref 30.0–36.0)
MCV: 80 fL (ref 78.0–100.0)
Monocytes Absolute: 0.9 10*3/uL (ref 0.1–1.0)
Monocytes Relative: 7 %
NEUTROS ABS: 8.4 10*3/uL — AB (ref 1.7–7.7)
NEUTROS PCT: 74 %
Platelets: 217 10*3/uL (ref 150–400)
RBC: 5.71 MIL/uL (ref 4.22–5.81)
RDW: 15.6 % — AB (ref 11.5–15.5)
WBC: 11.5 10*3/uL — AB (ref 4.0–10.5)

## 2016-05-12 LAB — URINALYSIS, ROUTINE W REFLEX MICROSCOPIC
BACTERIA UA: NONE SEEN
BILIRUBIN URINE: NEGATIVE
GLUCOSE, UA: NEGATIVE mg/dL
HGB URINE DIPSTICK: NEGATIVE
Ketones, ur: NEGATIVE mg/dL
NITRITE: NEGATIVE
PROTEIN: NEGATIVE mg/dL
Specific Gravity, Urine: 1.021 (ref 1.005–1.030)
pH: 5 (ref 5.0–8.0)

## 2016-05-12 LAB — LIPASE, BLOOD: Lipase: 15 U/L (ref 11–51)

## 2016-05-12 MED ORDER — PANTOPRAZOLE SODIUM 20 MG PO TBEC: 20.0000 mg | DELAYED_RELEASE_TABLET | Freq: Every day | ORAL | 0 refills | Status: DC

## 2016-05-12 MED ORDER — KETOROLAC TROMETHAMINE 30 MG/ML IJ SOLN
30.0000 mg | Freq: Once | INTRAMUSCULAR | Status: AC
  Administered 2016-05-12: 30 mg via INTRAVENOUS
  Filled 2016-05-12: qty 1

## 2016-05-12 MED ORDER — ONDANSETRON 4 MG PO TBDP: 4.0000 mg | ORAL_TABLET | Freq: Three times a day (TID) | ORAL | 0 refills | Status: DC | PRN

## 2016-05-12 MED ORDER — SODIUM CHLORIDE 0.9 % IV BOLUS (SEPSIS)
1000.0000 mL | Freq: Once | INTRAVENOUS | Status: AC
  Administered 2016-05-12: 1000 mL via INTRAVENOUS

## 2016-05-12 MED ORDER — ONDANSETRON HCL 4 MG/2ML IJ SOLN
4.0000 mg | Freq: Once | INTRAMUSCULAR | Status: AC
  Administered 2016-05-12: 4 mg via INTRAVENOUS
  Filled 2016-05-12: qty 2

## 2016-05-12 MED ORDER — GI COCKTAIL ~~LOC~~
30.0000 mL | Freq: Once | ORAL | Status: AC
  Administered 2016-05-12: 30 mL via ORAL
  Filled 2016-05-12: qty 30

## 2016-05-12 NOTE — ED Triage Notes (Signed)
Pt reports abdominal pain since Tuesday and vomiting that began yesterday. Pt reports he is unable to keep anything down, denies fevers or diarrhea, denies dark or bloody stools. Pt a/ox4, resp, e/u, nad.

## 2016-05-12 NOTE — Discharge Instructions (Signed)
Zofran as needed for nausea. Take protonix daily until you see GI. Please call the GI clinic listed today or tomorrow to schedule a follow up appointment.   Please seek immediate care if you develop any of the following symptoms: The pain does not go away.  You have a fever.  You keep throwing up (vomiting). You pass bloody or black tarry stools.  There is bright red blood in the stool.  Constipation for more than 4 days.  There is rectal pain.  You do not seem to be getting better.  You have any questions or concerns.

## 2016-05-12 NOTE — ED Notes (Signed)
ED Provider at bedside. 

## 2016-05-12 NOTE — ED Provider Notes (Signed)
[facility] DEPT Provider Note   CSN: [id number] Arrival date & time: 05/12/16  4098     History   Chief Complaint Chief Complaint  Patient presents with  . Emesis  . Abdominal Pain    HPI Juan Grimes is a 31 y.o. male.  The history is provided by the patient and medical records. No language interpreter was used.   Juan Grimes is a 31 y.o. male  with a PMH of bipolar disorder, HTN, seizures who presents to the Emergency Department complaining of worsening burning upper abdominal pain2 days. Patient endorses nausea as well as emesis that began yesterday. Patient states that he has not been able to keep any food or fluids down as he throws up at each attempt. No episodes of emesis without trying to eat or drink something. Pain exacerbated by eating/drinking. No medications taken prior to arrival for symptoms. No history of similar sxs. No fevers, chills, diarrhea, constipation, blood in the stool, back pain, dysuria, chest pain, shortness of breath.   Past Medical History:  Diagnosis Date  . Bipolar 1 disorder, manic, moderate ([place])   . HTN (hypertension)    normal renin 2008  . Obese   . Plantar fasciitis   . Seizure [facility])    reported massive seizure in 2008    Patient Active Problem List   Diagnosis Date Noted  . Right knee pain 01/19/2016  . Rash and nonspecific skin eruption 07/10/2014  . Urinary incontinence 07/10/2014  . Otitis externa 05/22/2014  . OSA on CPAP 11/25/2010  . History of Seizure 11/25/2010  . HTN (hypertension) 11/15/2010  . Obesity, morbid (BMI > 40) 11/15/2010  . Knee pain after work (morbid obesity and on feet) 11/15/2010  . Bipolar I disorder ([place]) 11/15/2010    Past Surgical History:  Procedure Laterality Date  . TONSILECTOMY, ADENOIDECTOMY, BILATERAL MYRINGOTOMY AND TUBES     1996       Home Medications    Prior to Admission medications   Medication Sig Start Date End Date Taking? Authorizing Provider    hydrochlorothiazide (HYDRODIURIL) 25 MG tablet Take 1 tablet (25 mg total) by mouth daily. 02/02/16  Yes Vivi Barrack, MD  lisinopril (PRINIVIL,ZESTRIL) 10 MG tablet Take 1 tablet (10 mg total) by mouth daily. 01/19/16  Yes Vivi Barrack, MD  LORazepam (ATIVAN) 1 MG tablet Take 2 mg by mouth every 6 (six) hours as needed for anxiety.   Yes Historical Provider, MD  lurasidone (LATUDA) 40 MG TABS tablet Take 60 mg by mouth daily with breakfast.   Yes Historical Provider, MD  meloxicam (MOBIC) 15 MG tablet Take 1 tablet (15 mg total) by mouth daily. 01/19/16  Yes Vivi Barrack, MD  diphenhydrAMINE (BENADRYL) 25 mg capsule Take 1 capsule (25 mg total) by mouth every 6 (six) hours as needed. 05/18/14   Britt Bottom, NP  famotidine (PEPCID) 20 MG tablet Take 1 tablet (20 mg total) by mouth 2 (two) times daily. 05/18/14   Britt Bottom, NP  lamoTRIgine (LAMICTAL) 200 MG tablet Take 200 mg by mouth 2 (two) times daily. 04/06/16   Historical Provider, MD  ondansetron (ZOFRAN ODT) 4 MG disintegrating tablet Take 1 tablet (4 mg total) by mouth every 8 (eight) hours as needed for nausea or vomiting. 05/12/16   Ozella Almond Laqueena Hinchey, PA-C  pantoprazole (PROTONIX) 20 MG tablet Take 1 tablet (20 mg total) by mouth daily. 05/12/16   Sultan, PA-C    Family History Family History  Problem Relation Age of Onset  . Bipolar disorder Father   . Hypertension Father   . Fibromyalgia Mother     Social History Social History  Substance Use Topics  . Smoking status: Never Smoker  . Smokeless tobacco: Current User    Types: Snuff  . Alcohol use 1.0 oz/week    2 Standard drinks or equivalent per week     Allergies   Patient has no known allergies.   Review of Systems Review of Systems  Constitutional: Negative for chills and fever.  HENT: Negative for congestion.   Eyes: Negative for visual disturbance.  Respiratory: Negative for cough and shortness of breath.   Cardiovascular:  Negative.   Gastrointestinal: Positive for abdominal pain, nausea and vomiting. Negative for blood in stool, constipation and diarrhea.  Genitourinary: Negative for dysuria.  Musculoskeletal: Negative for back pain and neck pain.  Skin: Negative for rash.  Neurological: Negative for headaches.     Physical Exam Updated Vital Signs BP 135/67 (BP Location: Left Arm)   Pulse 87   Temp 98.1 F (36.7 C) (Oral)   Resp 14   SpO2 99%   Physical Exam  Constitutional: He is oriented to person, place, and time. He appears well-developed and well-nourished. No distress.  Non-toxic appearing.   HENT:  Head: Normocephalic and atraumatic.  Neck: Neck supple.  Cardiovascular: Normal rate, regular rhythm and normal heart sounds.   No murmur heard. Pulmonary/Chest: Effort normal and breath sounds normal. No respiratory distress.  Abdominal: Soft. Bowel sounds are normal. He exhibits no distension. There is tenderness.  Tenderness to palpation of the epigastrium and right upper quadrant without rebound or guarding. Negative Murphy's.  Neurological: He is alert and oriented to person, place, and time.  Skin: Skin is warm and dry.  Good cap refill.   Nursing note and vitals reviewed.    ED Treatments / Results  Labs (all labs ordered are listed, but only abnormal results are displayed) Labs Reviewed  COMPREHENSIVE METABOLIC PANEL - Abnormal; Notable for the following:       Result Value   Potassium 3.4 (*)    Chloride 98 (*)    Glucose, Bld 116 (*)    Calcium 8.8 (*)    All other components within normal limits  CBC WITH DIFFERENTIAL/PLATELET - Abnormal; Notable for the following:    WBC 11.5 (*)    RDW 15.6 (*)    Neutro Abs 8.4 (*)    All other components within normal limits  URINALYSIS, ROUTINE W REFLEX MICROSCOPIC - Abnormal; Notable for the following:    APPearance HAZY (*)    Leukocytes, UA TRACE (*)    Squamous Epithelial / LPF 0-5 (*)    All other components within normal  limits  LIPASE, BLOOD    EKG  EKG Interpretation None       Radiology US Abdomen Limited Ruq  Result Date: 05/12/2016 CLINICAL DATA:  Abdominal pain for 1.5 days. EXAM: US ABDOMEN LIMITED - RIGHT UPPER QUADRANT COMPARISON:  None. FINDINGS: Gallbladder: No gallstones or wall thickening visualized. No sonographic Murphy sign noted by sonographer. Common bile duct: Diameter: 3.4 mm Liver: No focal lesion identified. Increased hepatic parenchymal echogenicity. IMPRESSION: 1. No cholelithiasis or sonographic evidence of acute cholecystitis. 2. Increased hepatic echogenicity as can be seen with hepatic steatosis. Electronically Signed   By: Kathreen Devoid   On: 05/12/2016 10:29    Procedures Procedures (including critical care time)  Medications Ordered in ED Medications  sodium chloride 0.9 %  bolus 1,000 mL (1,000 mLs Intravenous New Bag/Given 05/12/16 0926)  ondansetron (ZOFRAN) injection 4 mg (4 mg Intravenous Given 05/12/16 0926)  ketorolac (TORADOL) 30 MG/ML injection 30 mg (30 mg Intravenous Given 05/12/16 0926)  gi cocktail (Maalox,Lidocaine,Donnatal) (30 mLs Oral Given 05/12/16 1104)     Initial Impression / Assessment and Plan / ED Course  I have reviewed the triage vital signs and the nursing notes.  Pertinent labs & imaging results that were available during my care of the patient were reviewed by me and considered in my medical decision making (see chart for details).    Juan Grimes is a 31 y.o. male who presents to ED for upper abdominal pain x 2 days associated with emesis that began today. On exam, patient is afebrile, hemodynamically stable with tenderness to palpation of the epigastrium and RUQ. Labs reviewed and reassuring. Mild leukocytosis at 11.5. RUQ ultrasound obtained showing No cholelithiasis or evidence of acute cholecystitis. He does have increased hepatic echogenicity consistent with hepatic steatosis. Patient given Zofran and Toradol as well as IV fluids. On  reevaluation, patient notes mild improvement of symptoms. He feels as if he can tolerate fluids now. GI cocktail given and we will reevaluate.  Patient reevaluated. GI cocktail helped with symptoms. No emesis while in ED.  Repeat abdominal exam improved-nonsurgical abdomen. Will start patient on Protonix and give Zofran as needed for nausea. GI follow up encouraged if symptoms persist. Patient informed to call tomorrow morning to schedule appointment. Reasons to return to the ER were discussed length. All questions answered.   Final Clinical Impressions(s) / ED Diagnoses   Final diagnoses:  Abdominal pain  Epigastric abdominal pain    New Prescriptions New Prescriptions   ONDANSETRON (ZOFRAN ODT) 4 MG DISINTEGRATING TABLET    Take 1 tablet (4 mg total) by mouth every 8 (eight) hours as needed for nausea or vomiting.   PANTOPRAZOLE (PROTONIX) 20 MG TABLET    Take 1 tablet (20 mg total) by mouth daily.     [facility] Girtha Kilgore, PA-C 05/12/16 [place], MD 05/13/16 [phone]

## 2016-07-21 ENCOUNTER — Ambulatory Visit (INDEPENDENT_AMBULATORY_CARE_PROVIDER_SITE_OTHER): Payer: Managed Care, Other (non HMO) | Admitting: Family Medicine

## 2016-07-21 ENCOUNTER — Encounter: Payer: Self-pay | Admitting: Family Medicine

## 2016-07-21 DIAGNOSIS — I10 Essential (primary) hypertension: Secondary | ICD-10-CM | POA: Diagnosis not present

## 2016-07-21 DIAGNOSIS — M25561 Pain in right knee: Secondary | ICD-10-CM

## 2016-07-21 MED ORDER — TRAMADOL HCL 50 MG PO TABS: 50.0000 mg | ORAL_TABLET | Freq: Three times a day (TID) | ORAL | 0 refills | Status: DC | PRN

## 2016-07-21 MED ORDER — LISINOPRIL 10 MG PO TABS: 10.0000 mg | ORAL_TABLET | Freq: Every day | ORAL | 11 refills | Status: DC

## 2016-07-21 MED ORDER — HYDROCHLOROTHIAZIDE 25 MG PO TABS: 25.0000 mg | ORAL_TABLET | Freq: Every day | ORAL | 3 refills | Status: DC

## 2016-07-21 NOTE — Progress Notes (Signed)
   Subjective:  Juan Grimes is a 31 y.o. male who presents to the [facility] today with a chief complaint of hypertension.   HPI:  Hypertension BP Readings from Last 3 Encounters:  07/21/16 128/78  05/12/16 131/89  02/02/16 124/82   Home BP monitoring-No Compliant with medications-yes, without side effects ROS-Denies any CP, HA, SOB, blurry vision, LE edema, transient weakness, orthopnea, PND.   Knee Pain Chronic problem for patient. Worse since his last visit here. He has been taking ibuprofen which helps some with the pain. Pain is worse with movement and weight bearing. Plain film was ordered but patient has not had them yet. He refuses to have injections and wishes to continue with medical management.   ROS: Per HPI  PMH: Smoking history reviewed.   Objective:  Physical Exam: BP 128/78   Pulse (!) 102   Ht 5\' 10"  (1.778 m)   Wt (!) 338 lb (153.3 kg)   SpO2 97%   BMI 48.50 kg/m   Gen: NAD, resting comfortably CV: RRR with no murmurs appreciated Pulm: NWOB, CTAB with no crackles, wheezes, or rhonchi MSK:  - R Knee: No deformities. No effusions. FROM. Tender along medial joint line. Skin: warm, dry Neuro: grossly normal, moves all extremities Psych: Normal affect and thought content  Assessment/Plan:  HTN (hypertension) At goal. Continue lisinopril and HCTZ.   Right knee pain Strongly encouraged patient to have plain films. Patient again deferred injections. Given that he is still having symptoms with ibuprofen and tylenol, will start a short course of tramadol. If plain film negative, would consider MRI.  Algis Greenhouse. Jerline Pain, [place] Resident PGY-3 07/21/2016 11:15 AM

## 2016-07-21 NOTE — Patient Instructions (Signed)
Your blood pressure looks good today.  Please have xrays of your knees. Continue the tramadol. Use ibuprofen and tylenol as needed.  Come back in 6 months, or sooner as needed.  Take care,  Dr Jerline Pain

## 2016-07-21 NOTE — Assessment & Plan Note (Signed)
Strongly encouraged patient to have plain films. Patient again deferred injections. Given that he is still having symptoms with ibuprofen and tylenol, will start a short course of tramadol. If plain film negative, would consider MRI.

## 2016-07-21 NOTE — Assessment & Plan Note (Signed)
At goal. Continue lisinopril and HCTZ. 

## 2017-06-06 ENCOUNTER — Ambulatory Visit (INDEPENDENT_AMBULATORY_CARE_PROVIDER_SITE_OTHER): Payer: Self-pay | Admitting: Family Medicine

## 2017-06-06 ENCOUNTER — Other Ambulatory Visit: Payer: Self-pay

## 2017-06-06 ENCOUNTER — Encounter: Payer: Self-pay | Admitting: Family Medicine

## 2017-06-06 VITALS — BP 154/88 | HR 94 | Temp 98.5°F | Ht 70.0 in | Wt 370.0 lb

## 2017-06-06 DIAGNOSIS — I10 Essential (primary) hypertension: Secondary | ICD-10-CM

## 2017-06-06 DIAGNOSIS — G8929 Other chronic pain: Secondary | ICD-10-CM

## 2017-06-06 DIAGNOSIS — M25561 Pain in right knee: Secondary | ICD-10-CM

## 2017-06-06 MED ORDER — TROLAMINE SALICYLATE 10 % EX CREA: 1.0000 "application " | TOPICAL_CREAM | CUTANEOUS | 0 refills | Status: DC | PRN

## 2017-06-06 MED ORDER — DICLOFENAC SODIUM 75 MG PO TBEC: 75.0000 mg | DELAYED_RELEASE_TABLET | Freq: Two times a day (BID) | ORAL | 0 refills | Status: DC

## 2017-06-06 MED ORDER — HYDROCHLOROTHIAZIDE 25 MG PO TABS: 25.0000 mg | ORAL_TABLET | Freq: Every day | ORAL | 3 refills | Status: DC

## 2017-06-06 NOTE — Progress Notes (Signed)
    Subjective:  Juan Grimes is a 32 y.o. male who presents to the [facility] today for blood pressure follow up and follow up for chronic knee pain.  HPI:  Hypertension Hypertensive today. Has been out of his HCTZ 25 mg.  Takes lisinopril 10 mg daily.  Denies any lower extremity swelling, chest pain, shortness of breath.  Right Knee Pain Patient has had chronic right knee pain for several years.  Denies any injury.  Has trialed tramadol the past and this has worked well for him.  Ibuprofen and Tylenol do not work for him for his pain.  His pain is primarily when walking and worse when standing up his legs during his work.  Patient refuses to get right knee x-rays as he says he cannot afford it at this time.  He has been requested to get plain films of his knee multiple times.    ROS: Per HPI  PMH: Smoking history reviewed.    Objective:  Physical Exam: BP (!) 154/88   Pulse 94   Temp 98.5 F (36.9 C) (Oral)   Ht 5\' 10"  (1.778 m)   Wt (!) 370 lb (167.8 kg)   SpO2 98%   BMI 53.09 kg/m   Gen: NAD, resting comfortably CV: RRR with no murmurs appreciated Pulm: NWOB, CTAB with no crackles, wheezes, or rhonchi GI: Normal bowel sounds present. Soft, Nontender, Nondistended. MSK: Bilateral knee joints are stable, patella is not easily dislocated, full range of motion, 5 5 strength, no edema, cyanosis, or clubbing noted Skin: warm, dry Neuro: grossly normal, moves all extremities Psych: Normal affect and thought content  No results found for this or any previous visit (from the past 72 hour(s)).   Assessment/Plan:  No problem-specific Assessment & Plan notes found for this encounter.    Lab Orders     Basic Metabolic Panel  Meds ordered this encounter  Medications  . hydrochlorothiazide (HYDRODIURIL) 25 MG tablet    Sig: Take 1 tablet (25 mg total) by mouth daily.    Dispense:  90 tablet    Refill:  3  . diclofenac (VOLTAREN) 75 MG EC tablet    Sig: Take 1 tablet (75 mg  total) by mouth 2 (two) times daily.    Dispense:  30 tablet    Refill:  0  . trolamine salicylate (ASPERCREME) 10 % cream    Sig: Apply 1 application topically as needed for muscle pain.    Dispense:  85 g    Refill:  0    Marny Lowenstein, MD, MS FAMILY MEDICINE RESIDENT - PGY1 06/06/2017 4:16 PM

## 2017-06-06 NOTE — Patient Instructions (Addendum)
It was a pleasure to see you today! Thank you for choosing [facility] for your primary care. Juan Grimes was seen for high blood pressure and knee pain. We are going to get labs to look at your kidneys. Try the diclofenac and asper cream together for knee as needed. Follow up in 6 mo to recheck blood pressure. Come back to clinic if your knee pain is not improved.   Best,  Marny Lowenstein, MD, MS FAMILY MEDICINE RESIDENT - PGY1 06/06/2017 4:10 PM

## 2017-06-07 LAB — BASIC METABOLIC PANEL
BUN / CREAT RATIO: 24 — AB (ref 9–20)
BUN: 17 mg/dL (ref 6–20)
CO2: 21 mmol/L (ref 20–29)
CREATININE: 0.72 mg/dL — AB (ref 0.76–1.27)
Calcium: 8.9 mg/dL (ref 8.7–10.2)
Chloride: 102 mmol/L (ref 96–106)
GFR calc Af Amer: 141 mL/min/{1.73_m2} (ref >59)
GFR, EST NON AFRICAN AMERICAN: 122 mL/min/{1.73_m2} (ref >59)
Glucose: 97 mg/dL (ref 65–99)
Potassium: 4.2 mmol/L (ref 3.5–5.2)
SODIUM: 141 mmol/L (ref 134–144)

## 2017-06-09 ENCOUNTER — Encounter: Payer: Self-pay | Admitting: *Deleted

## 2017-06-12 ENCOUNTER — Encounter: Payer: Self-pay | Admitting: Family Medicine

## 2017-06-12 NOTE — Assessment & Plan Note (Addendum)
Persistent right knee pain, undetermined etiology.  Given the patient has not trialed.  Given patient does not wish to get plain films, did not feel comfortable prescribing tramadol ad infinitum.  Patient has not tried topical agents nor diclofenac.  Will try these to see if it improves pain.

## 2017-06-12 NOTE — Assessment & Plan Note (Signed)
Patient is hypertensive day but has been out of his HCTZ.  Will refill today and have patient return to recheck blood pressure.  Will obtain BMP to follow creatinine.

## 2017-08-06 ENCOUNTER — Other Ambulatory Visit: Payer: Self-pay | Admitting: Family Medicine

## 2017-08-17 ENCOUNTER — Other Ambulatory Visit: Payer: Self-pay | Admitting: *Deleted

## 2017-08-17 MED ORDER — LISINOPRIL 10 MG PO TABS: 10.0000 mg | ORAL_TABLET | Freq: Every day | ORAL | 11 refills | Status: DC

## 2017-09-03 ENCOUNTER — Emergency Department (HOSPITAL_COMMUNITY)
Admission: EM | Admit: 2017-09-03 | Discharge: 2017-09-03 | Disposition: A | Discharge status: Home / Self Care | Payer: Self-pay | Attending: Emergency Medicine | Admitting: Emergency Medicine

## 2017-09-03 ENCOUNTER — Encounter (HOSPITAL_COMMUNITY): Payer: Self-pay | Admitting: Emergency Medicine

## 2017-09-03 DIAGNOSIS — K0889 Other specified disorders of teeth and supporting structures: Secondary | ICD-10-CM | POA: Insufficient documentation

## 2017-09-03 DIAGNOSIS — F17290 Nicotine dependence, other tobacco product, uncomplicated: Secondary | ICD-10-CM | POA: Insufficient documentation

## 2017-09-03 DIAGNOSIS — I10 Essential (primary) hypertension: Secondary | ICD-10-CM | POA: Insufficient documentation

## 2017-09-03 DIAGNOSIS — Z79899 Other long term (current) drug therapy: Secondary | ICD-10-CM | POA: Insufficient documentation

## 2017-09-03 MED ORDER — OXYCODONE-ACETAMINOPHEN 5-325 MG PO TABS: 1.0000 | ORAL_TABLET | Freq: Three times a day (TID) | ORAL | 0 refills | Status: DC | PRN

## 2017-09-03 MED ORDER — NAPROXEN 500 MG PO TABS: 500.0000 mg | ORAL_TABLET | Freq: Two times a day (BID) | ORAL | 0 refills | Status: DC

## 2017-09-03 MED ORDER — PENICILLIN V POTASSIUM 500 MG PO TABS: 500.0000 mg | ORAL_TABLET | Freq: Four times a day (QID) | ORAL | 0 refills | Status: AC

## 2017-09-03 MED ORDER — PENICILLIN V POTASSIUM 250 MG PO TABS
500.0000 mg | ORAL_TABLET | Freq: Once | ORAL | Status: AC
  Administered 2017-09-03: 500 mg via ORAL
  Filled 2017-09-03: qty 2

## 2017-09-03 MED ORDER — KETOROLAC TROMETHAMINE 30 MG/ML IJ SOLN
30.0000 mg | Freq: Once | INTRAMUSCULAR | Status: AC
  Administered 2017-09-03: 30 mg via INTRAMUSCULAR
  Filled 2017-09-03: qty 1

## 2017-09-03 NOTE — ED Provider Notes (Signed)
[place] EMERGENCY DEPARTMENT Provider Note   CSN: [id number] Arrival date & time: 09/03/17  1137     History   Chief Complaint Chief Complaint  Patient presents with  . Dental Pain    HPI Juan Grimes is a 32 y.o. male with a past medical history of bipolar disorder, hypertension, presents to ED for evaluation of dental pain since last night.  States that the symptoms got worse today.  He cannot recall any inciting event that may have triggered the symptoms.  He took 1 dose of ibuprofen with only mild improvement in his symptoms.  States that this is happened to him in the past.  He has not seen a dentist in several years.  He tried to make an appointment with his dentist but it is close this week for the holiday and for renovations.  He denies any trouble breathing or trouble swallowing, fever, drainage or bleeding from the site, changes in voice, sore throat, drooling, trouble opening mouth.  HPI  Past Medical History:  Diagnosis Date  . Bipolar 1 disorder, manic, moderate ([place])   . HTN (hypertension)    normal renin 2008  . Obese   . Plantar fasciitis   . Seizure [facility])    reported massive seizure in 2008    Patient Active Problem List   Diagnosis Date Noted  . Right knee pain 01/19/2016  . Rash and nonspecific skin eruption 07/10/2014  . Urinary incontinence 07/10/2014  . OSA on CPAP 11/25/2010  . History of Seizure 11/25/2010  . HTN (hypertension) 11/15/2010  . Obesity, morbid (BMI > 40) 11/15/2010  . Knee pain after work (morbid obesity and on feet) 11/15/2010  . Bipolar I disorder ([place]) 11/15/2010    Past Surgical History:  Procedure Laterality Date  . TONSILECTOMY, ADENOIDECTOMY, BILATERAL MYRINGOTOMY AND TUBES     1996        Home Medications    Prior to Admission medications   Medication Sig Start Date End Date Taking? Authorizing Provider  diclofenac (VOLTAREN) 75 MG EC tablet Take 1 tablet (75 mg total) by mouth 2 (two)  times daily. 06/06/17   Bonnita Hollow, MD  diphenhydrAMINE (BENADRYL) 25 mg capsule Take 1 capsule (25 mg total) by mouth every 6 (six) hours as needed. 05/18/14   Britt Bottom, NP  famotidine (PEPCID) 20 MG tablet Take 1 tablet (20 mg total) by mouth 2 (two) times daily. 05/18/14   Britt Bottom, NP  hydrochlorothiazide (HYDRODIURIL) 25 MG tablet Take 1 tablet (25 mg total) by mouth daily. 06/06/17   Bonnita Hollow, MD  lamoTRIgine (LAMICTAL) 200 MG tablet Take 200 mg by mouth 2 (two) times daily. 04/06/16   [provider]  lisinopril (PRINIVIL,ZESTRIL) 10 MG tablet Take 1 tablet (10 mg total) by mouth daily. 08/17/17   Bonnita Hollow, MD  LORazepam (ATIVAN) 1 MG tablet Take 2 mg by mouth every 6 (six) hours as needed for anxiety.    [provider]  ondansetron (ZOFRAN ODT) 4 MG disintegrating tablet Take 1 tablet (4 mg total) by mouth every 8 (eight) hours as needed for nausea or vomiting. 05/12/16   Ward, Ozella Almond, PA-C  pantoprazole (PROTONIX) 20 MG tablet Take 1 tablet (20 mg total) by mouth daily. 05/12/16   Ward, Ozella Almond, PA-C  QUEtiapine (SEROQUEL) 200 MG tablet Take 200 mg by mouth at bedtime.    [provider]  traMADol (ULTRAM) 50 MG tablet Take 1 tablet (50 mg total)  by mouth every 8 (eight) hours as needed. 07/21/16   Vivi Barrack, MD  trolamine salicylate (ASPERCREME) 10 % cream Apply 1 application topically as needed for muscle pain. 06/06/17   Bonnita Hollow, MD    Family History Family History  Problem Relation Age of Onset  . Bipolar disorder Father   . Hypertension Father   . Fibromyalgia Mother     Social History Social History   Tobacco Use  . Smoking status: Never Smoker  . Smokeless tobacco: Current User    Types: Snuff  Substance Use Topics  . Alcohol use: Yes    Alcohol/week: 1.2 oz    Types: 2 Standard drinks or equivalent per week  . Drug use: No     Allergies   Patient has no known  allergies.   Review of Systems Review of Systems  Constitutional: Negative for chills and fever.  HENT: Positive for dental problem. Negative for mouth sores, rhinorrhea, sinus pressure, sore throat and trouble swallowing.   Respiratory: Negative for shortness of breath.   Musculoskeletal: Negative for neck pain and neck stiffness.  Skin: Negative for color change.     Physical Exam Updated Vital Signs BP 131/85 (BP Location: Right Arm)   Pulse 95   Temp 98.2 F (36.8 C)   Resp 18   SpO2 99%   Physical Exam  Constitutional: He appears well-developed and well-nourished. No distress.  HENT:  Head: Normocephalic and atraumatic.  Mouth/Throat: Uvula is midline. No oral lesions. Abnormal dentition. Dental caries present. No uvula swelling. No posterior oropharyngeal erythema.    Overall poor dentition with several missing and decaying teeth. There is a chipped tooth in the area with induration surrounding the gum. No fluctuance noted. No facial, neck or cheek swelling noted. No pooling of secretions or trismus.  Normal voice noted with no difficulty swallowing or breathing. No submandibular erythema, edema or crepitus noted.  Eyes: Conjunctivae and EOM are normal. No scleral icterus.  Neck: Normal range of motion.  Pulmonary/Chest: Effort normal. No respiratory distress.  Neurological: He is alert.  Skin: No rash noted. He is not diaphoretic.  Psychiatric: He has a normal mood and affect.  Nursing note and vitals reviewed.    ED Treatments / Results  Labs (all labs ordered are listed, but only abnormal results are displayed) Labs Reviewed - No data to display  EKG None  Radiology No results found.  Procedures Procedures (including critical care time)  Medications Ordered in ED Medications  ketorolac (TORADOL) 30 MG/ML injection 30 mg (30 mg Intramuscular Given 09/03/17 1242)  penicillin v potassium (VEETID) tablet 500 mg (500 mg Oral Given 09/03/17 1242)      Initial Impression / Assessment and Plan / ED Course  I have reviewed the triage vital signs and the nursing notes.  Pertinent labs & imaging results that were available during my care of the patient were reviewed by me and considered in my medical decision making (see chart for details).     Patient with dentalgia. On exam, there is no evidence of a drainable abscess. No trismus, glossal elevation, unilateral tonsillar swelling. No evidence of retropharyngeal or peritonsillar abscess or Ludwig angina. Will treat with  antibiotics, short course of pain medication and anti-inflammatories. Pt instructed to follow-up with dentist as soon as possible.   Portions of this note were generated with [employer]. Dictation errors may occur despite best attempts at proofreading.    Final Clinical Impressions(s) / ED Diagnoses  Final diagnoses:  Pain, dental    ED Discharge Orders    None       Delia Heady, PA-C 09/03/17 1326    Milton Ferguson, MD 09/03/17 [phone]

## 2017-09-03 NOTE — ED Triage Notes (Signed)
Patient to ED c/o R lower dental abscess with facial swelling onset yesterday. No fevers/chills. Patient was going to see his dentist tomorrow but their office is closed all week for holiday.

## 2017-09-03 NOTE — Discharge Instructions (Signed)
Return to ED for worsening pain, trouble talking or breathing, trouble swallowing, increased swelling or drainage. Please follow up with your dentist. Complete the entire course of antibiotics regardless of symptom improvement to prevent worsening or recurrence of your infection.

## 2017-10-09 ENCOUNTER — Telehealth: Payer: Self-pay | Admitting: Family Medicine

## 2017-10-09 NOTE — Telephone Encounter (Signed)
Called to schedule appt for - Return in about 6 months (around 12/06/2017) for bp recheck - Pt declined and said money is tight right now and could not come. Said he would call back to schedule if he could come.

## 2017-11-08 ENCOUNTER — Ambulatory Visit (INDEPENDENT_AMBULATORY_CARE_PROVIDER_SITE_OTHER): Payer: Self-pay | Admitting: Family Medicine

## 2017-11-08 ENCOUNTER — Encounter: Payer: Self-pay | Admitting: Family Medicine

## 2017-11-08 ENCOUNTER — Other Ambulatory Visit: Payer: Self-pay

## 2017-11-08 VITALS — BP 138/82 | HR 98 | Temp 98.3°F | Ht 70.0 in | Wt 350.0 lb

## 2017-11-08 DIAGNOSIS — I10 Essential (primary) hypertension: Secondary | ICD-10-CM

## 2017-11-08 MED ORDER — LISINOPRIL 20 MG PO TABS: 20.0000 mg | ORAL_TABLET | Freq: Every day | ORAL | 4 refills | Status: DC

## 2017-11-08 NOTE — Progress Notes (Signed)
Subjective:    Patient here for follow-up of elevated blood pressure.  He is not exercising and is not adherent to a low-salt diet.  Blood pressure is not well controlled at home. Cardiac symptoms: none. Patient denies: chest pain, chest pressure/discomfort, dyspnea and irregular heart beat. Cardiovascular risk factors: family history of premature cardiovascular disease, hypertension, male gender, obesity (BMI >= 30 kg/m2) and sedentary lifestyle. Use of agents associated with hypertension: NSAIDS. History of target organ damage: none.  The following portions of the patient's history were reviewed and updated as appropriate: allergies, current medications, past family history, past medical history, past social history, past surgical history and problem list.  Review of Systems Pertinent items noted in HPI and remainder of comprehensive ROS otherwise negative.     Objective:   Vitals:   11/08/17 1445  BP: 138/82  Pulse: 98  Temp: 98.3 F (36.8 C)  TempSrc: Oral  SpO2: 95%  Weight: (!) 350 lb (158.8 kg)  Height: 5\' 10"  (1.778 m)   Gen: NAD, resting comfortably CV: RRR with no murmurs appreciated Pulm: NWOB, CTAB with no crackles, wheezes, or rhonchi MSK: no edema, cyanosis, or clubbing noted Neuro: grossly normal, moves all extremities  Assessment and Plan:  HTN (hypertension) Blood pressure above goal of 130/80.  Multiple readings both at home per patient, and in clinic.  Recent BMP 06/2016 showed creatinine 0.7. -Increase lisinopril to 20 mg patient return in 3 months for blood pressure check

## 2017-11-08 NOTE — Assessment & Plan Note (Signed)
Blood pressure above goal of 130/80.  Multiple readings both at home per patient, and in clinic.  Recent BMP 06/2016 showed creatinine 0.7. -Increase lisinopril to 20 mg patient return in 3 months for blood pressure check

## 2017-11-08 NOTE — Patient Instructions (Signed)
Managing Your Hypertension Hypertension is commonly called high blood pressure. This is when the force of your blood pressing against the walls of your arteries is too strong. Arteries are blood vessels that carry blood from your heart throughout your body. Hypertension forces the heart to work harder to pump blood, and may cause the arteries to become narrow or stiff. Having untreated or uncontrolled hypertension can cause heart attack, stroke, kidney disease, and other problems. What are blood pressure readings? A blood pressure reading consists of a higher number over a lower number. Ideally, your blood pressure should be below 120/80. The first ("top") number is called the systolic pressure. It is a measure of the pressure in your arteries as your heart beats. The second ("bottom") number is called the diastolic pressure. It is a measure of the pressure in your arteries as the heart relaxes. What does my blood pressure reading mean? Blood pressure is classified into four stages. Based on your blood pressure reading, your health care provider may use the following stages to determine what type of treatment you need, if any. Systolic pressure and diastolic pressure are measured in a unit called mm Hg. Normal  Systolic pressure: below 120.  Diastolic pressure: below 80. Elevated  Systolic pressure: 120-129.  Diastolic pressure: below 80. Hypertension stage 1  Systolic pressure: 130-139.  Diastolic pressure: 80-89. Hypertension stage 2  Systolic pressure: 140 or above.  Diastolic pressure: 90 or above. What health risks are associated with hypertension? Managing your hypertension is an important responsibility. Uncontrolled hypertension can lead to:  A heart attack.  A stroke.  A weakened blood vessel (aneurysm).  Heart failure.  Kidney damage.  Eye damage.  Metabolic syndrome.  Memory and concentration problems.  What changes can I make to manage my  hypertension? Hypertension can be managed by making lifestyle changes and possibly by taking medicines. Your health care provider will help you make a plan to bring your blood pressure within a normal range. Eating and drinking  Eat a diet that is high in fiber and potassium, and low in salt (sodium), added sugar, and fat. An example eating plan is called the DASH (Dietary Approaches to Stop Hypertension) diet. To eat this way: ? Eat plenty of fresh fruits and vegetables. Try to fill half of your plate at each meal with fruits and vegetables. ? Eat whole grains, such as whole wheat pasta, brown rice, or whole grain bread. Fill about one quarter of your plate with whole grains. ? Eat low-fat diary products. ? Avoid fatty cuts of meat, processed or cured meats, and poultry with skin. Fill about one quarter of your plate with lean proteins such as fish, chicken without skin, beans, eggs, and tofu. ? Avoid premade and processed foods. These tend to be higher in sodium, added sugar, and fat.  Reduce your daily sodium intake. Most people with hypertension should eat less than 1,500 mg of sodium a day.  Limit alcohol intake to no more than 1 drink a day for nonpregnant women and 2 drinks a day for men. One drink equals 12 oz of beer, 5 oz of wine, or 1 oz of hard liquor. Lifestyle  Work with your health care provider to maintain a healthy body weight, or to lose weight. Ask what an ideal weight is for you.  Get at least 30 minutes of exercise that causes your heart to beat faster (aerobic exercise) most days of the week. Activities may include walking, swimming, or biking.  Include exercise   to strengthen your muscles (resistance exercise), such as weight lifting, as part of your weekly exercise routine. Try to do these types of exercises for 30 minutes at least 3 days a week.  Do not use any products that contain nicotine or tobacco, such as cigarettes and e-cigarettes. If you need help quitting, ask  your health care provider.  Control any long-term (chronic) conditions you have, such as high cholesterol or diabetes. Monitoring  Monitor your blood pressure at home as told by your health care provider. Your personal target blood pressure may vary depending on your medical conditions, your age, and other factors.  Have your blood pressure checked regularly, as often as told by your health care provider. Working with your health care provider  Review all the medicines you take with your health care provider because there may be side effects or interactions.  Talk with your health care provider about your diet, exercise habits, and other lifestyle factors that may be contributing to hypertension.  Visit your health care provider regularly. Your health care provider can help you create and adjust your plan for managing hypertension. Will I need medicine to control my blood pressure? Your health care provider may prescribe medicine if lifestyle changes are not enough to get your blood pressure under control, and if:  Your systolic blood pressure is 130 or higher.  Your diastolic blood pressure is 80 or higher.  Take medicines only as told by your health care provider. Follow the directions carefully. Blood pressure medicines must be taken as prescribed. The medicine does not work as well when you skip doses. Skipping doses also puts you at risk for problems. Contact a health care provider if:  You think you are having a reaction to medicines you have taken.  You have repeated (recurrent) headaches.  You feel dizzy.  You have swelling in your ankles.  You have trouble with your vision. Get help right away if:  You develop a severe headache or confusion.  You have unusual weakness or numbness, or you feel faint.  You have severe pain in your chest or abdomen.  You vomit repeatedly.  You have trouble breathing. Summary  Hypertension is when the force of blood pumping through  your arteries is too strong. If this condition is not controlled, it may put you at risk for serious complications.  Your personal target blood pressure may vary depending on your medical conditions, your age, and other factors. For most people, a normal blood pressure is less than 120/80.  Hypertension is managed by lifestyle changes, medicines, or both. Lifestyle changes include weight loss, eating a healthy, low-sodium diet, exercising more, and limiting alcohol. This information is not intended to replace advice given to you by your health care provider. Make sure you discuss any questions you have with your health care provider. Document Released: 11/16/2011 Document Revised: 01/20/2016 Document Reviewed: 01/20/2016 Elsevier Interactive Patient Education  2018 [employer] Inc.  

## 2018-05-08 ENCOUNTER — Other Ambulatory Visit: Payer: Self-pay

## 2018-05-08 ENCOUNTER — Ambulatory Visit (INDEPENDENT_AMBULATORY_CARE_PROVIDER_SITE_OTHER): Payer: Self-pay | Admitting: Family Medicine

## 2018-05-08 ENCOUNTER — Encounter: Payer: Self-pay | Admitting: Family Medicine

## 2018-05-08 VITALS — BP 124/72 | HR 105 | Temp 98.7°F | Ht 70.0 in | Wt 360.4 lb

## 2018-05-08 DIAGNOSIS — J069 Acute upper respiratory infection, unspecified: Secondary | ICD-10-CM

## 2018-05-08 MED ORDER — LORATADINE-PSEUDOEPHEDRINE ER 10-240 MG PO TB24: 1.0000 | ORAL_TABLET | Freq: Every day | ORAL | 0 refills | Status: AC

## 2018-05-08 MED ORDER — IPRATROPIUM BROMIDE 0.06 % NA SOLN: 2.0000 | Freq: Four times a day (QID) | NASAL | 12 refills | Status: DC

## 2018-05-08 MED ORDER — IBUPROFEN 600 MG PO TABS: 600.0000 mg | ORAL_TABLET | Freq: Three times a day (TID) | ORAL | 0 refills | Status: DC | PRN

## 2018-05-08 NOTE — Patient Instructions (Signed)

## 2018-05-08 NOTE — Progress Notes (Signed)
Acute Office Visit  Subjective:    Patient ID: Juan Grimes, male    DOB: 1985/12/27, 33 y.o.   MRN: [id number]  Chief Complaint  Patient presents with  . Knee Pain  . Shoulder Pain    Father is in the hospital with influenza pneumonia. Patient has to stay home and watch his children since his father is not able to be there to watch them. It is difficult for him since he is sick.   URI   This is a new problem. The current episode started in the past 7 days. The problem has been gradually worsening. There has been no fever. Associated symptoms include congestion, coughing, headaches, rhinorrhea, sinus pain, sneezing and a sore throat. Pertinent negatives include no abdominal pain, chest pain, diarrhea, dysuria, ear pain, joint pain, nausea, neck pain, plugged ear sensation, rash, swollen glands, vomiting or wheezing. He has tried acetaminophen and decongestant for the symptoms. The treatment provided mild relief.     Past Medical History:  Diagnosis Date  . Bipolar 1 disorder, manic, moderate ([place])   . HTN (hypertension)    normal renin 2008  . Obese   . Plantar fasciitis   . Seizure [facility])    reported massive seizure in 2008    Past Surgical History:  Procedure Laterality Date  . TONSILECTOMY, ADENOIDECTOMY, BILATERAL MYRINGOTOMY AND TUBES     1996    Family History  Problem Relation Age of Onset  . Bipolar disorder Father   . Hypertension Father   . Fibromyalgia Mother     Social History   Socioeconomic History  . Marital status: Single    Spouse name: Not on file  . Number of children: Not on file  . Years of education: Not on file  . Highest education level: Not on file  Occupational History  . Not on file  Social Needs  . Financial resource strain: Not on file  . Food insecurity:    Worry: Not on file    Inability: Not on file  . Transportation needs:    Medical: Not on file    Non-medical: Not on file  Tobacco Use  . Smoking status: Never Smoker   . Smokeless tobacco: Current User    Types: Snuff  Substance and Sexual Activity  . Alcohol use: Yes    Alcohol/week: 2.0 standard drinks    Types: 2 Standard drinks or equivalent per week  . Drug use: No  . Sexual activity: Not on file  Lifestyle  . Physical activity:    Days per week: Not on file    Minutes per session: Not on file  . Stress: Not on file  Relationships  . Social connections:    Talks on phone: Not on file    Gets together: Not on file    Attends religious service: Not on file    Active member of club or organization: Not on file    Attends meetings of clubs or organizations: Not on file    Relationship status: Not on file  . Intimate partner violence:    Fear of current or ex partner: Not on file    Emotionally abused: Not on file    Physically abused: Not on file    Forced sexual activity: Not on file  Other Topics Concern  . Not on file  Social History Narrative   Lives with mother and father. Never married. 4 dogs and a fish.       Employed at [employer]  30 hrs/wk. Did not finish high school.      Hobbies: books, video games, backpacking, fishing      Sometimes has difficulty understanding medical information.    Outpatient Medications Prior to Visit  Medication Sig Dispense Refill  . diphenhydrAMINE (BENADRYL) 25 mg capsule Take 1 capsule (25 mg total) by mouth every 6 (six) hours as needed. 30 capsule 0  . hydrochlorothiazide (HYDRODIURIL) 25 MG tablet Take 1 tablet (25 mg total) by mouth daily. 90 tablet 3  . lamoTRIgine (LAMICTAL) 200 MG tablet Take 200 mg by mouth 2 (two) times daily.    Marland Kitchen lisinopril (PRINIVIL,ZESTRIL) 20 MG tablet Take 1 tablet (20 mg total) by mouth daily. 90 tablet 4  . LORazepam (ATIVAN) 1 MG tablet Take 2 mg by mouth every 6 (six) hours as needed for anxiety.    . ondansetron (ZOFRAN ODT) 4 MG disintegrating tablet Take 1 tablet (4 mg total) by mouth every 8 (eight) hours as needed for nausea or vomiting. 10 tablet 0  .  pantoprazole (PROTONIX) 20 MG tablet Take 1 tablet (20 mg total) by mouth daily. 30 tablet 0  . QUEtiapine (SEROQUEL) 200 MG tablet Take 200 mg by mouth at bedtime.    . traMADol (ULTRAM) 50 MG tablet Take 1 tablet (50 mg total) by mouth every 8 (eight) hours as needed. 30 tablet 0  . trolamine salicylate (ASPERCREME) 10 % cream Apply 1 application topically as needed for muscle pain. 85 g 0  . naproxen (NAPROSYN) 500 MG tablet Take 1 tablet (500 mg total) by mouth 2 (two) times daily. 30 tablet 0   No facility-administered medications prior to visit.     No Known Allergies  Review of Systems  HENT: Positive for congestion, rhinorrhea, sinus pain, sneezing and sore throat. Negative for ear pain.   Respiratory: Positive for cough. Negative for wheezing.   Cardiovascular: Negative for chest pain.  Gastrointestinal: Negative for abdominal pain, diarrhea, nausea and vomiting.  Genitourinary: Negative for dysuria.  Musculoskeletal: Negative for joint pain and neck pain.  Skin: Negative for rash.  Neurological: Positive for headaches.       Objective:    Physical Exam  Constitutional: He appears well-developed and well-nourished. No distress.  HENT:  Head: Normocephalic and atraumatic.  Nose: Rhinorrhea present. No epistaxis.  No foreign bodies.  Eyes: Pupils are equal, round, and reactive to light. Conjunctivae are normal. Left eye exhibits no discharge.  Neck: Neck supple.  Cardiovascular: Normal rate and regular rhythm.  Pulmonary/Chest: Effort normal and breath sounds normal. No respiratory distress. He has no wheezes.  Lymphadenopathy:    He has no cervical adenopathy.  Neurological: He is alert. He exhibits normal muscle tone.  Skin: Skin is warm and dry.  Vitals reviewed.  BP 124/72   Pulse (!) 105   Temp 98.7 F (37.1 C) (Oral)   Ht 5\' 10"  (1.778 m)   Wt (!) 360 lb 6.4 oz (163.5 kg)   SpO2 97%   BMI 51.71 kg/m  Wt Readings from Last 3 Encounters:  05/08/18 (!) 360  lb 6.4 oz (163.5 kg)  11/08/17 (!) 350 lb (158.8 kg)  06/06/17 (!) 370 lb (167.8 kg)    Health Maintenance Due  Topic Date Due  . HIV Screening  05/04/1998  . INFLUENZA VACCINE  10/05/2017    There are no preventive care reminders to display for this patient.   No results found for: TSH Lab Results  Component Value Date   WBC 11.5 (H)  05/12/2016   HGB 15.4 05/12/2016   HCT 45.7 05/12/2016   MCV 80.0 05/12/2016   PLT 217 05/12/2016   Lab Results  Component Value Date   NA 141 06/06/2017   K 4.2 06/06/2017   CO2 21 06/06/2017   GLUCOSE 97 06/06/2017   BUN 17 06/06/2017   CREATININE 0.72 (L) 06/06/2017   BILITOT 0.9 05/12/2016   ALKPHOS 49 05/12/2016   AST 23 05/12/2016   ALT 31 05/12/2016   PROT 7.0 05/12/2016   ALBUMIN 4.1 05/12/2016   CALCIUM 8.9 06/06/2017   ANIONGAP 12 05/12/2016   Lab Results  Component Value Date   CHOL 122 01/19/2016   Lab Results  Component Value Date   HDL 30 (L) 01/19/2016   Lab Results  Component Value Date   LDLCALC 77 01/19/2016   Lab Results  Component Value Date   TRIG 73 01/19/2016   Lab Results  Component Value Date   CHOLHDL 4.1 01/19/2016   Lab Results  Component Value Date   HGBA1C 5.1 02/02/2016       Assessment & Plan:   Problem List Items Addressed This Visit    None    Visit Diagnoses    Viral upper respiratory tract infection    -  Primary   Relevant Medications   loratadine-pseudoephedrine (CLARITIN-D 24 HOUR) 10-240 MG 24 hr tablet   ipratropium (ATROVENT) 0.06 % nasal spray   ibuprofen (ADVIL,MOTRIN) 600 MG tablet     Patient presenting with S&S of URI. Recommend good PO intake of fluids. Short course of symptom management. Patient denies taking naproxen at this time. So will write for ibuprofen for sore throat. Pt will h/o HTN on HCTZ. Normotensive today. Pseudoephedrine can raise BP, however, short course is unlikely to have prolonged effect given patient is 33 yo. Return precautions given.    Meds ordered this encounter  Medications  . loratadine-pseudoephedrine (CLARITIN-D 24 HOUR) 10-240 MG 24 hr tablet    Sig: Take 1 tablet by mouth daily for 10 days.    Dispense:  10 tablet    Refill:  0  . ipratropium (ATROVENT) 0.06 % nasal spray    Sig: Place 2 sprays into both nostrils 4 (four) times daily.    Dispense:  15 mL    Refill:  12  . ibuprofen (ADVIL,MOTRIN) 600 MG tablet    Sig: Take 1 tablet (600 mg total) by mouth every 8 (eight) hours as needed.    Dispense:  30 tablet    Refill:  0     Bonnita Hollow, MD

## 2018-06-14 ENCOUNTER — Other Ambulatory Visit: Payer: Self-pay | Admitting: Family Medicine

## 2018-06-14 DIAGNOSIS — I10 Essential (primary) hypertension: Secondary | ICD-10-CM

## 2018-09-17 ENCOUNTER — Other Ambulatory Visit: Payer: Self-pay | Admitting: Family Medicine

## 2018-09-17 DIAGNOSIS — I10 Essential (primary) hypertension: Secondary | ICD-10-CM

## 2018-10-18 ENCOUNTER — Ambulatory Visit (INDEPENDENT_AMBULATORY_CARE_PROVIDER_SITE_OTHER): Payer: Managed Care, Other (non HMO) | Admitting: Family Medicine

## 2018-10-18 ENCOUNTER — Other Ambulatory Visit: Payer: Self-pay

## 2018-10-18 ENCOUNTER — Encounter: Payer: Self-pay | Admitting: Family Medicine

## 2018-10-18 VITALS — BP 132/84 | HR 87 | Ht 70.0 in | Wt 360.0 lb

## 2018-10-18 DIAGNOSIS — R1032 Left lower quadrant pain: Secondary | ICD-10-CM

## 2018-10-18 DIAGNOSIS — R739 Hyperglycemia, unspecified: Secondary | ICD-10-CM | POA: Insufficient documentation

## 2018-10-18 LAB — POCT GLYCOSYLATED HEMOGLOBIN (HGB A1C): Hemoglobin A1C: 5.5 % (ref 4.0–5.6)

## 2018-10-18 MED ORDER — IBUPROFEN 800 MG PO TABS: 800.0000 mg | ORAL_TABLET | Freq: Three times a day (TID) | ORAL | 0 refills | Status: AC | PRN

## 2018-10-18 NOTE — Progress Notes (Addendum)
    Subjective:  Juan Grimes is a 33 y.o. male who presents to the [facility] today with a chief complaint of groin pain and concern for diabetes.   HPI:  Groin pain Occurs on left side.  Duration about 1 week.  Can occur with movement but is not constant.  Denies trauma or injury to the area.  No history of belly surgeries.  Denies nausea, vomiting.  Patient does not smoke.  Patient is been taking 800 mg ibuprofen PRN for pain, this does help some.  Pain is better at rest.  Patient is not sexually active.  Denies any testicular or penile pain.  Denies penile discharge.  Denies fevers chills.  Rash on leg Patient has bilateral Browning rash on legs.  Is not erythematous, is not painful is not pruritic.  Is gradually being worse over the past several years.  Patient says that his friends have similar rash on his legs and thinks it may be diabetes.  Has been taking his blood sugar at home and has been elevated in the 130s.  He would like to be screened for diabetes.  ROS: Per HPI  PMH: Smoking history reviewed.    Objective:  Physical Exam: BP 132/84   Pulse 87   Ht 5\' 10"  (1.778 m)   Wt (!) 360 lb (163.3 kg)   SpO2 97%   BMI 51.65 kg/m   Gen: NAD, resting comfortably CV: RRR with no murmurs appreciated Pulm: NWOB, CTAB with no crackles, wheezes, or rhonchi GI: Soft, Nontender, Nondistended. GU: Normal-appearing testes and penis.  Left inguinal region tender.  No palpable or observable hernia. MSK: no edema, cyanosis, or clubbing noted Skin: warm, dry, multiple brown patches on lower distal lower extremity, primarily on shins Neuro: grossly normal, moves all extremities Psych: Normal affect and thought content  Results for orders placed or performed in visit on 10/18/18 (from the past 72 hour(s))  HgB A1c     Status: None   Collection Time: 10/18/18  4:17 PM  Result Value Ref Range   Hemoglobin A1C 5.5 4.0 - 5.6 %   HbA1c POC (<> result, manual entry)     HbA1c, POC  (prediabetic range)     HbA1c, POC (controlled diabetic range)       Assessment/Plan:  LLQ pain Predominantly in the left inguinal region.  Concern for possible inguinal hernia.  Will screen with ultrasound.  Tylenol and ibuprofen PRN for pain.  Emergency and return precautions given.  Hyperglycemia Patient reporting hyperglycemia at home with CBGs in the 130s.  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  Normal hemoglobin A1c 2017.  But has multiple risk factors including first-degree relatives, morbid obesity. Will rescreen for diabetes.    Lab Orders     HgB A1c  Meds ordered this encounter  Medications  . ibuprofen (ADVIL) 800 MG tablet    Sig: Take 1 tablet (800 mg total) by mouth every 8 (eight) hours as needed for up to 14 days for moderate pain.    Dispense:  30 tablet    Refill:  0      Marny Lowenstein, MD, MS FAMILY MEDICINE RESIDENT - PGY3 10/18/2018 4:49 PM

## 2018-10-18 NOTE — Patient Instructions (Signed)
It was a pleasure to see you today! Thank you for choosing [facility] for your primary care. Juan Grimes was seen for groin pain.   1. We are going to screen you for a hernia with an ultrasound. We are prescribing you ibuprofen for pain.   2. We are screening you for diabetes with a hemoglobin A1C  Come back to the clinic or go to the emergency room if you develop worsening abdominal pain, nausea vomiting  Best,  Marny Lowenstein, MD, [place] - PGY3 10/18/2018 4:05 PM

## 2018-10-18 NOTE — Assessment & Plan Note (Signed)
Predominantly in the left inguinal region.  Concern for possible inguinal hernia.  Will screen with ultrasound.  Tylenol and ibuprofen PRN for pain.  Emergency and return precautions given.

## 2018-10-18 NOTE — Assessment & Plan Note (Signed)
Patient reporting hyperglycemia at home with CBGs in the 130s.  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  Normal hemoglobin A1c 2017.  But has multiple risk factors including first-degree relatives, morbid obesity. Will rescreen for diabetes.

## 2018-10-25 ENCOUNTER — Ambulatory Visit
Admission: RE | Admit: 2018-10-25 | Discharge: 2018-10-25 | Disposition: A | Discharge status: Home / Self Care | Payer: Managed Care, Other (non HMO) | Source: Ambulatory Visit | Attending: Family Medicine | Admitting: Family Medicine

## 2018-10-25 ENCOUNTER — Encounter: Payer: Self-pay | Admitting: Family Medicine

## 2018-10-25 DIAGNOSIS — R1032 Left lower quadrant pain: Secondary | ICD-10-CM

## 2018-10-25 DIAGNOSIS — R590 Localized enlarged lymph nodes: Secondary | ICD-10-CM

## 2018-10-25 NOTE — Progress Notes (Signed)
Pelvic US neg for hernia, but found bilateral inguinal adenopathy that was tender to palpation. Plan to get CT pelvis w/ contrast as recommended to evaluate further. Called patient. No answer. LVM detailing this plan. Also sending a letter.

## 2018-10-29 ENCOUNTER — Telehealth: Payer: Self-pay | Admitting: *Deleted

## 2018-10-29 MED ORDER — TRAMADOL HCL 50 MG PO TABS: 50.0000 mg | ORAL_TABLET | Freq: Three times a day (TID) | ORAL | 0 refills | Status: AC | PRN

## 2018-10-29 NOTE — Telephone Encounter (Signed)
Pt states that his groin pain is not helped by ibuprofen.  Is requesting something else be called in. Christen Bame, CMA

## 2018-10-29 NOTE — Telephone Encounter (Signed)
Will Rx tramadol for 1 week. Patient will need to get CT abdomen/pelvis as ordered. If pain is getting worse, he will need to come in or go to the emergency dept if bad enough. Will have staff call patient.

## 2018-10-29 NOTE — Addendum Note (Signed)
Addended by: Bonnita Hollow on: 10/29/2018 09:22 PM   Modules accepted: Orders

## 2018-10-30 NOTE — Telephone Encounter (Signed)
LM for patient letting him know that medication was refilled for 1 week and reminded him of the upcoming CT scan.  Jazmin Hartsell,CMA

## 2018-10-30 NOTE — Telephone Encounter (Signed)
Ct is scheduled for 11-02-2018. Jazmin Hartsell,CMA

## 2018-11-02 ENCOUNTER — Other Ambulatory Visit: Payer: Managed Care, Other (non HMO)

## 2018-12-11 ENCOUNTER — Other Ambulatory Visit: Payer: Self-pay | Admitting: Family Medicine

## 2018-12-11 DIAGNOSIS — I10 Essential (primary) hypertension: Secondary | ICD-10-CM

## 2018-12-14 ENCOUNTER — Telehealth: Payer: Self-pay | Admitting: Family Medicine

## 2018-12-14 NOTE — Telephone Encounter (Signed)
Called patient to inform him that his CT abdomen/pelvis was denied by insurance company. No answer. LVM telling him we could appeal if he is interested.

## 2019-02-11 ENCOUNTER — Other Ambulatory Visit: Payer: Self-pay | Admitting: Family Medicine

## 2019-02-11 DIAGNOSIS — I10 Essential (primary) hypertension: Secondary | ICD-10-CM

## 2019-03-27 ENCOUNTER — Other Ambulatory Visit: Payer: Self-pay | Admitting: Family Medicine

## 2019-03-27 DIAGNOSIS — I10 Essential (primary) hypertension: Secondary | ICD-10-CM

## 2019-06-01 ENCOUNTER — Other Ambulatory Visit: Payer: Self-pay | Admitting: Family Medicine

## 2019-06-01 DIAGNOSIS — I10 Essential (primary) hypertension: Secondary | ICD-10-CM

## 2019-07-04 ENCOUNTER — Telehealth: Payer: Self-pay

## 2019-07-04 ENCOUNTER — Other Ambulatory Visit: Payer: Self-pay

## 2019-07-04 ENCOUNTER — Emergency Department (HOSPITAL_COMMUNITY): Payer: Managed Care, Other (non HMO)

## 2019-07-04 ENCOUNTER — Emergency Department (HOSPITAL_COMMUNITY)
Admission: EM | Admit: 2019-07-04 | Discharge: 2019-07-04 | Disposition: A | Discharge status: Home / Self Care | Payer: Managed Care, Other (non HMO) | Attending: Emergency Medicine | Admitting: Emergency Medicine

## 2019-07-04 ENCOUNTER — Encounter (HOSPITAL_COMMUNITY): Payer: Self-pay | Admitting: Emergency Medicine

## 2019-07-04 DIAGNOSIS — I10 Essential (primary) hypertension: Secondary | ICD-10-CM | POA: Diagnosis not present

## 2019-07-04 DIAGNOSIS — Z79899 Other long term (current) drug therapy: Secondary | ICD-10-CM | POA: Diagnosis not present

## 2019-07-04 DIAGNOSIS — K219 Gastro-esophageal reflux disease without esophagitis: Secondary | ICD-10-CM | POA: Diagnosis not present

## 2019-07-04 DIAGNOSIS — R0789 Other chest pain: Secondary | ICD-10-CM | POA: Diagnosis present

## 2019-07-04 DIAGNOSIS — F319 Bipolar disorder, unspecified: Secondary | ICD-10-CM | POA: Insufficient documentation

## 2019-07-04 DIAGNOSIS — F17290 Nicotine dependence, other tobacco product, uncomplicated: Secondary | ICD-10-CM | POA: Diagnosis not present

## 2019-07-04 DIAGNOSIS — R1110 Vomiting, unspecified: Secondary | ICD-10-CM | POA: Insufficient documentation

## 2019-07-04 LAB — HEPATIC FUNCTION PANEL
ALT: 44 U/L (ref 0–44)
AST: 35 U/L (ref 15–41)
Albumin: 4.4 g/dL (ref 3.5–5.0)
Alkaline Phosphatase: 59 U/L (ref 38–126)
Bilirubin, Direct: 0.1 mg/dL (ref 0.0–0.2)
Indirect Bilirubin: 0.8 mg/dL (ref 0.3–0.9)
Total Bilirubin: 0.9 mg/dL (ref 0.3–1.2)
Total Protein: 7.1 g/dL (ref 6.5–8.1)

## 2019-07-04 LAB — CBC
HCT: 47.2 % (ref 39.0–52.0)
Hemoglobin: 15.2 g/dL (ref 13.0–17.0)
MCH: 26.6 pg (ref 26.0–34.0)
MCHC: 32.2 g/dL (ref 30.0–36.0)
MCV: 82.7 fL (ref 80.0–100.0)
Platelets: 216 10*3/uL (ref 150–400)
RBC: 5.71 MIL/uL (ref 4.22–5.81)
RDW: 15.6 % — ABNORMAL HIGH (ref 11.5–15.5)
WBC: 7.2 10*3/uL (ref 4.0–10.5)
nRBC: 0 % (ref 0.0–0.2)

## 2019-07-04 LAB — BASIC METABOLIC PANEL
Anion gap: 13 (ref 5–15)
BUN: 14 mg/dL (ref 6–20)
CO2: 25 mmol/L (ref 22–32)
Calcium: 9.8 mg/dL (ref 8.9–10.3)
Chloride: 101 mmol/L (ref 98–111)
Creatinine, Ser: 0.92 mg/dL (ref 0.61–1.24)
GFR calc Af Amer: >60 mL/min (ref >60)
GFR calc non Af Amer: >60 mL/min (ref >60)
Glucose, Bld: 113 mg/dL — ABNORMAL HIGH (ref 70–99)
Potassium: 3.9 mmol/L (ref 3.5–5.1)
Sodium: 139 mmol/L (ref 135–145)

## 2019-07-04 LAB — TROPONIN I (HIGH SENSITIVITY): Troponin I (High Sensitivity): 3 ng/L (ref <18)

## 2019-07-04 MED ORDER — LIDOCAINE VISCOUS HCL 2 % MT SOLN
15.0000 mL | Freq: Once | OROMUCOSAL | Status: AC
  Administered 2019-07-04: 15 mL via ORAL
  Filled 2019-07-04: qty 15

## 2019-07-04 MED ORDER — PANTOPRAZOLE SODIUM 40 MG IV SOLR: 40.0000 mg | Freq: Once | INTRAVENOUS | Status: DC

## 2019-07-04 MED ORDER — SODIUM CHLORIDE 0.9% FLUSH: 3.0000 mL | Freq: Once | INTRAVENOUS | Status: DC

## 2019-07-04 MED ORDER — OMEPRAZOLE 20 MG PO CPDR: 20.0000 mg | DELAYED_RELEASE_CAPSULE | Freq: Every day | ORAL | 0 refills | Status: DC

## 2019-07-04 MED ORDER — ALUM & MAG HYDROXIDE-SIMETH 200-200-20 MG/5ML PO SUSP
30.0000 mL | Freq: Once | ORAL | Status: AC
  Administered 2019-07-04: 20:00:00 30 mL via ORAL
  Filled 2019-07-04: qty 30

## 2019-07-04 NOTE — Discharge Instructions (Addendum)
Your laboratory results were within normal limits today.  I prescribed a short course of acid reducing medication, please take 1 tablet daily for the next 7 days.  Please follow-up with your primary care physician for reevaluation after completion of this medication.

## 2019-07-04 NOTE — Telephone Encounter (Signed)
Patient calls nurse line regarding new onset chest pain. Patient states that pain started yesterday and is now constant pain. Patient also reports pain in left arm. Patient denies shortness of breath or dizziness.   Advised patient to be evaluated in the ED.   Talbot Grumbling, RN

## 2019-07-04 NOTE — ED Provider Notes (Signed)
[facility] EMERGENCY DEPARTMENT Provider Note   CSN: [id number] Arrival date & time: 07/04/19  1250     History Chief Complaint  Patient presents with   Chest Pain    Juan Grimes is a 34 y.o. male.  34 y.o male with a PMH of HTN, Bipolar disorder, hyperglycemia presents to the ED with a chief complaint of left-sided chest pain which began yesterday around 10 AM.  Reports noting this pain yesterday at work.  Describes it now as a burning sensation, somewhat relieved with sips of soda.  Has taken Tums which does help with the pain but then it returns.  Has a history of high blood pressure, has been compliant with medications.  He is also taking ibuprofen as he reports he felt this might have been a pulled muscle with improvement.  Attempted to be seen at [facility] today, was referred to the ED for further evaluation.  No shortness of breath, prior history of CAD, prior history of blood clots, no prior history of tobacco use.   The history is provided by medical records and the patient.  Chest Pain Pain location:  L chest Pain quality: aching   Pain radiates to:  Does not radiate Pain severity:  Mild Onset quality:  Sudden Duration:  1 day Timing:  Constant Progression:  Unchanged Chronicity:  New Associated symptoms: vomiting   Associated symptoms: no abdominal pain, no back pain, no fever, no headache, no nausea and no shortness of breath   Risk factors: hypertension, male sex and obesity   Risk factors: no coronary artery disease, no diabetes mellitus, no prior DVT/PE and no smoking        Past Medical History:  Diagnosis Date   Bipolar 1 disorder, manic, moderate (HCC)    HTN (hypertension)    normal renin 2008   Obese    Plantar fasciitis    Seizure ([place])    reported massive seizure in 2008    Patient Active Problem List   Diagnosis Date Noted   LLQ pain 10/18/2018   Hyperglycemia 10/18/2018   Right knee  pain 01/19/2016   Rash and nonspecific skin eruption 07/10/2014   Urinary incontinence 07/10/2014   OSA on CPAP 11/25/2010   History of Seizure 11/25/2010   HTN (hypertension) 11/15/2010   Obesity, morbid (BMI > 40) 11/15/2010   Knee pain after work (morbid obesity and on feet) 11/15/2010   Bipolar I disorder ([place]) 11/15/2010    Past Surgical History:  Procedure Laterality Date   TONSILECTOMY, ADENOIDECTOMY, BILATERAL MYRINGOTOMY AND TUBES     1996       Family History  Problem Relation Age of Onset   Bipolar disorder Father    Hypertension Father    Fibromyalgia Mother     Social History   Tobacco Use   Smoking status: Never Smoker   Smokeless tobacco: Current User    Types: Snuff  Substance Use Topics   Alcohol use: Yes    Alcohol/week: 2.0 standard drinks    Types: 2 Standard drinks or equivalent per week   Drug use: No    Home Medications Prior to Admission medications   Medication Sig Start Date End Date Taking? Authorizing Provider  diazepam (VALIUM) 10 MG tablet Take 10 mg by mouth as needed for anxiety.  02/07/19  Yes [provider]  diphenhydrAMINE (BENADRYL) 25 mg capsule Take 1 capsule (25 mg total) by mouth every 6 (six) hours as needed. 05/18/14  Yes Tysinger, Benjamine Mola, NP  Glucosamine HCl (GLUCOSAMINE PO) Take 1 tablet by mouth at bedtime.   Yes [provider]  hydrochlorothiazide (HYDRODIURIL) 25 MG tablet Take 1 tablet by mouth once daily Patient taking differently: Take 25 mg by mouth at bedtime.  03/27/19  Yes Bonnita Hollow, MD  ipratropium (ATROVENT) 0.06 % nasal spray Place 2 sprays into both nostrils 4 (four) times daily. Patient taking differently: Place 2 sprays into both nostrils as needed for rhinitis.  05/08/18  Yes Bonnita Hollow, MD  lamoTRIgine (LAMICTAL) 200 MG tablet Take 400 mg by mouth at bedtime.  04/06/16  Yes [provider]  lisinopril (ZESTRIL) 20 MG tablet Take 1 tablet by mouth  once daily Patient taking differently: Take 20 mg by mouth at bedtime.  06/05/19  Yes Anderson, Chelsey L, DO  Multiple Vitamin (MULTIVITAMIN ADULT PO) Take 1 tablet by mouth at bedtime.   Yes [provider]  QUEtiapine (SEROQUEL) 200 MG tablet Take 200 mg by mouth at bedtime.   Yes [provider]  traZODone (DESYREL) 100 MG tablet Take 200 mg by mouth at bedtime as needed for sleep. 04/17/19  Yes [provider]  omeprazole (PRILOSEC) 20 MG capsule Take 1 capsule (20 mg total) by mouth daily for 7 days. 07/04/19 07/11/19  Janeece Fitting, PA-C  ondansetron (ZOFRAN ODT) 4 MG disintegrating tablet Take 1 tablet (4 mg total) by mouth every 8 (eight) hours as needed for nausea or vomiting. Patient not taking: Reported on 07/04/2019 05/12/16   Ward, Ozella Almond, PA-C  pantoprazole (PROTONIX) 20 MG tablet Take 1 tablet (20 mg total) by mouth daily. Patient not taking: Reported on 07/04/2019 05/12/16   Ward, Ozella Almond, PA-C  trolamine salicylate (ASPERCREME) 10 % cream Apply 1 application topically as needed for muscle pain. Patient not taking: Reported on 07/04/2019 06/06/17   Bonnita Hollow, MD    Allergies    Patient has no known allergies.  Review of Systems   Review of Systems  Constitutional: Negative for fever.  HENT: Negative for sore throat.   Respiratory: Negative for shortness of breath.   Cardiovascular: Positive for chest pain.  Gastrointestinal: Positive for vomiting. Negative for abdominal pain, diarrhea and nausea.  Genitourinary: Negative for flank pain.  Musculoskeletal: Negative for back pain.  Skin: Negative for pallor and wound.  Neurological: Negative for light-headedness and headaches.  All other systems reviewed and are negative.   Physical Exam Updated Vital Signs BP 140/80    Pulse 77    Temp 98.4 F (36.9 C) (Oral)    Resp 10    Ht 5\' 11"  (1.803 m)    Wt (!) 163.3 kg    SpO2 98%    BMI 50.21 kg/m   Physical Exam Vitals and nursing note  reviewed.  Constitutional:      Appearance: He is well-developed. He is not ill-appearing or toxic-appearing.  HENT:     Head: Normocephalic and atraumatic.  Cardiovascular:     Rate and Rhythm: Normal rate.     Heart sounds: Normal heart sounds.  Pulmonary:     Effort: Pulmonary effort is normal. No tachypnea.     Breath sounds: No decreased breath sounds or wheezing.  Chest:     Chest wall: Tenderness present.    Abdominal:     Palpations: Abdomen is soft. There is no mass.  Skin:    General: Skin is warm and dry.  Neurological:     Mental Status: He is  alert and oriented to person, place, and time.     ED Results / Procedures / Treatments   Labs (all labs ordered are listed, but only abnormal results are displayed) Labs Reviewed  BASIC METABOLIC PANEL - Abnormal; Notable for the following components:      Result Value   Glucose, Bld 113 (*)    All other components within normal limits  CBC - Abnormal; Notable for the following components:   RDW 15.6 (*)    All other components within normal limits  HEPATIC FUNCTION PANEL  TROPONIN I (HIGH SENSITIVITY)    EKG EKG Interpretation  Date/Time:  Thursday July 04 2019 12:53:23 EDT Ventricular Rate:  94 PR Interval:  162 QRS Duration: 94 QT Interval:  336 QTC Calculation: 420 R Axis:   92 Text Interpretation: Normal sinus rhythm Rightward axis Incomplete right bundle branch block Borderline ECG No significant change since last tracing Confirmed by Wandra Arthurs [phone]) on 07/04/2019 6:17:52 PM   Radiology DG Chest 2 View  Result Date: 07/04/2019 CLINICAL DATA:  Chest pain EXAM: CHEST - 2 VIEW COMPARISON:  None. FINDINGS: The heart size and mediastinal contours are within normal limits. Both lungs are clear. No pleural effusion or pneumothorax. The visualized skeletal structures are unremarkable. IMPRESSION: No acute process in the chest. Electronically Signed   By: Macy Mis M.D.   On: 07/04/2019 13:26     Procedures Procedures (including critical care time)  Medications Ordered in ED Medications  sodium chloride flush (NS) 0.9 % injection 3 mL (has no administration in time range)  alum & mag hydroxide-simeth (MAALOX/MYLANTA) 200-200-20 MG/5ML suspension 30 mL (30 mLs Oral Given 07/04/19 1931)    And  lidocaine (XYLOCAINE) 2 % viscous mouth solution 15 mL (15 mLs Oral Given 07/04/19 1932)    ED Course  I have reviewed the triage vital signs and the nursing notes.  Pertinent labs & imaging results that were available during my care of the patient were reviewed by me and considered in my medical decision making (see chart for details).    MDM Rules/Calculators/A&P  Patient with a past medical history of hypertension, obesity presents to the ED with complaints of left-sided chest pain with radiation to his left arm for several days.  An episode of dizziness earlier which resolved, reports pain is modified with drinking soda.  He has also tried some Tums which do give him some relief however pain then returned.  He does report drinking 1 cup of coffee a day, 1 [email or web address], does endorse breakfast food which contains agrees.  No shortness of breath, nausea, vomiting with this episode.  Potation of his labs showed a CBC without leukocytosis, no and of anemia.  BMP without any electrolyte abnormality, creatinine level is within normal limits.  No anion gap.  Did report some epigastric discomfort, obtain a hepatic function panel to further evaluate any gallbladder etiology.  LFTs are within normal limits.  No pain along the right upper quadrant.  First troponin is 3, EKG is normal sinus rhythm without any changes consistent with infarct.  Chest x-ray reviewed by me without any signs of consolidation, acute process.  HEART SCORE 1-2  Lower suspicion for ACS, symptom has been going since yesterday, does improve with PPI.  He was given GI cocktail while in the ED, upon reassessment does report  improvement in symptoms, states he does feel the pain is left over but much improved.  Vitals have remained within normal limits, no signs of  hypoxia, no tachycardia, no prior history of DVT, no prior history of tobacco use or recent immobilization.  Lower suspicion for pulmonary embolism.  No pitting edema, no calf tenderness, no prior history of heart failure, no clinical signs and symptoms consistent with this either.  I discussed results of patient's labs and x-ray with patient at length, we will trial a short course of PPIs to see if this helps with his pain.  He was advised to follow-up with his PCP as well, does report having primary care established.  Vitals are otherwise within normal limits, patient is stable for discharge in stable condition.   Juan Grimes was evaluated in Emergency Department on 07/04/2019 for the symptoms described in the history of present illness. He was evaluated in the context of the global COVID-19 pandemic, which necessitated consideration that the patient might be at risk for infection with the SARS-CoV-2 virus that causes COVID-19. Institutional protocols and algorithms that pertain to the evaluation of patients at risk for COVID-19 are in a state of rapid change based on information released by regulatory bodies including the CDC and federal and state organizations. These policies and algorithms were followed during the patient's care in the ED.   Portions of this note were generated with [employer]. Dictation errors may occur despite best attempts at proofreading.  Final Clinical Impression(s) / ED Diagnoses Final diagnoses:  Gastroesophageal reflux disease, unspecified whether esophagitis present    Rx / DC Orders ED Discharge Orders         Ordered    omeprazole (PRILOSEC) 20 MG capsule  Daily     07/04/19 2010           Janeece Fitting, PA-C 07/04/19 2011    Drenda Freeze, MD 07/04/19 2157

## 2019-07-04 NOTE — ED Triage Notes (Signed)
Pt reports he has had intermittent left sided chest pain that radiates to the left arm for several days. Had episode of dizziness earlier that has resolved. Pain currently 5/10. Denies shortness of breath.

## 2019-07-04 NOTE — ED Notes (Signed)
Pt states he feels better than when he went in and that the medication seems to have made things much better

## 2019-07-10 ENCOUNTER — Encounter: Payer: Self-pay | Admitting: Family Medicine

## 2019-07-10 ENCOUNTER — Ambulatory Visit (INDEPENDENT_AMBULATORY_CARE_PROVIDER_SITE_OTHER): Payer: Managed Care, Other (non HMO) | Admitting: Family Medicine

## 2019-07-10 ENCOUNTER — Other Ambulatory Visit: Payer: Self-pay

## 2019-07-10 VITALS — BP 140/80 | HR 104 | Ht 70.0 in | Wt 350.5 lb

## 2019-07-10 DIAGNOSIS — R21 Rash and other nonspecific skin eruption: Secondary | ICD-10-CM | POA: Diagnosis not present

## 2019-07-10 DIAGNOSIS — I10 Essential (primary) hypertension: Secondary | ICD-10-CM

## 2019-07-10 DIAGNOSIS — K219 Gastro-esophageal reflux disease without esophagitis: Secondary | ICD-10-CM | POA: Diagnosis not present

## 2019-07-10 DIAGNOSIS — R0600 Dyspnea, unspecified: Secondary | ICD-10-CM

## 2019-07-10 DIAGNOSIS — Z9989 Dependence on other enabling machines and devices: Secondary | ICD-10-CM

## 2019-07-10 DIAGNOSIS — G4733 Obstructive sleep apnea (adult) (pediatric): Secondary | ICD-10-CM | POA: Diagnosis not present

## 2019-07-10 DIAGNOSIS — R0609 Other forms of dyspnea: Secondary | ICD-10-CM

## 2019-07-10 MED ORDER — HYDROCORTISONE 2.5 % EX OINT: TOPICAL_OINTMENT | Freq: Two times a day (BID) | CUTANEOUS | 3 refills | Status: DC

## 2019-07-10 MED ORDER — OMEPRAZOLE 40 MG PO CPDR: 40.0000 mg | DELAYED_RELEASE_CAPSULE | Freq: Every day | ORAL | 0 refills | Status: DC

## 2019-07-10 MED ORDER — LISINOPRIL 30 MG PO TABS: 30.0000 mg | ORAL_TABLET | Freq: Every day | ORAL | 3 refills | Status: DC

## 2019-07-10 NOTE — Assessment & Plan Note (Signed)
Refill omeprazole

## 2019-07-10 NOTE — Progress Notes (Signed)
    SUBJECTIVE:   CHIEF COMPLAINT / HPI:   Dyspnea with left-sided chest pain Patient reports that his father is currently has multiple heart issues and diabetes. Patient has had chest pain for several weeks. Was recently seen in the ED. Work-up was negative including chest x-ray, troponins, CBC, CMP. Patient did have improvement in chest pain when he was started on omeprazole from the ED. Patient does wake up in the middle the night even when using his CPAP. It has been several years since his last sleep study.  Rash on chest.  Has been there for approximately 6 months.  Seems to be worse when he is outside sweating.  Can be somewhat painful and itchy. PERTINENT  PMH / PSH: As above  OBJECTIVE:   BP 140/80 Comment: provider informed  Pulse (!) 104 Comment: provider informed  Ht 5\' 10"  (1.778 m)   Wt (!) 350 lb 8 oz (159 kg)   SpO2 97%   BMI 50.29 kg/m   Gen: NAD, resting comfortably CV: RRR with no murmurs appreciated Pulm: NWOB, CTAB with no crackles, wheezes, or rhonchi GI: Soft, Nontender, Nondistended. MSK: no edema, cyanosis, or clubbing noted Skin: warm, dry Neuro: grossly normal, moves all extremities Psych: Normal affect and thought content   ASSESSMENT/PLAN:   Dyspnea on exertion Associated with chest pain.  Recent ED evaluation for MI was negative.  Also had normal chest x-ray.  Based on exam, does not show signs of acute decompensated heart failure.  Given his awakenings at night and elevated BMI, it is likely poorly titrated CPAP versus OHS.   -Sleep study -Echocardiogram to rule out heart failure  Precepted with Dr. Erin Hearing  Rash and nonspecific skin eruption Nonspecific eruption that erythematous patches.  Possible irritation from excessive sweating being outside. -Hydrocortisone 2.5% -Follow-up as needed  Gastroesophageal reflux disease Refill omeprazole.     Bonnita Hollow, MD [place]

## 2019-07-10 NOTE — Assessment & Plan Note (Signed)
Nonspecific eruption that erythematous patches.  Possible irritation from excessive sweating being outside. -Hydrocortisone 2.5% -Follow-up as needed

## 2019-07-10 NOTE — Assessment & Plan Note (Addendum)
Associated with chest pain.  Recent ED evaluation for MI was negative.  Also had normal chest x-ray.  Based on exam, does not show signs of acute decompensated heart failure.  Given his awakenings at night and elevated BMI, it is likely poorly titrated CPAP versus OHS.   -Sleep study -Echocardiogram to rule out heart failure  Precepted with Dr. Erin Hearing

## 2019-07-10 NOTE — Patient Instructions (Addendum)
For your rash we are trying hydrocortisone cream.  For your reflux disease, we are increasing your omeprazole to 40 mg once a day. Please take this 30 minutes before eating.  For your shortness of breath, we are reordering a sleep study and we are ordering a picture of your heart.  For your blood pressure, we are increasing your lisinopril to 30 mg a day.

## 2019-07-12 ENCOUNTER — Other Ambulatory Visit: Payer: Self-pay

## 2019-07-12 ENCOUNTER — Ambulatory Visit (HOSPITAL_COMMUNITY)
Admission: RE | Admit: 2019-07-12 | Discharge: 2019-07-12 | Disposition: A | Discharge status: Home / Self Care | Payer: Managed Care, Other (non HMO) | Source: Ambulatory Visit | Attending: Family Medicine | Admitting: Family Medicine

## 2019-07-12 DIAGNOSIS — R569 Unspecified convulsions: Secondary | ICD-10-CM | POA: Diagnosis not present

## 2019-07-12 DIAGNOSIS — I10 Essential (primary) hypertension: Secondary | ICD-10-CM | POA: Diagnosis not present

## 2019-07-12 DIAGNOSIS — Z9989 Dependence on other enabling machines and devices: Secondary | ICD-10-CM | POA: Insufficient documentation

## 2019-07-12 DIAGNOSIS — G4733 Obstructive sleep apnea (adult) (pediatric): Secondary | ICD-10-CM | POA: Insufficient documentation

## 2019-07-12 DIAGNOSIS — I313 Pericardial effusion (noninflammatory): Secondary | ICD-10-CM | POA: Diagnosis not present

## 2019-07-12 DIAGNOSIS — R0609 Other forms of dyspnea: Secondary | ICD-10-CM

## 2019-07-12 DIAGNOSIS — R0600 Dyspnea, unspecified: Secondary | ICD-10-CM | POA: Insufficient documentation

## 2019-07-12 NOTE — Progress Notes (Signed)
  Echocardiogram 2D Echocardiogram has been performed.  [name] 07/12/2019, 3:42 PM

## 2019-07-17 ENCOUNTER — Other Ambulatory Visit: Payer: Self-pay | Admitting: Family Medicine

## 2019-07-17 ENCOUNTER — Telehealth: Payer: Self-pay

## 2019-07-17 DIAGNOSIS — I3139 Other pericardial effusion (noninflammatory): Secondary | ICD-10-CM

## 2019-07-17 NOTE — Telephone Encounter (Signed)
Spoke with pt informed him of ECHO at [facility] Fri. May 14,2021 at 1:00pm. Pt understood. Salvatore Marvel, CMA

## 2019-07-19 ENCOUNTER — Ambulatory Visit (HOSPITAL_COMMUNITY)
Admission: RE | Admit: 2019-07-19 | Discharge: 2019-07-19 | Disposition: A | Discharge status: Home / Self Care | Payer: Managed Care, Other (non HMO) | Source: Ambulatory Visit | Attending: Family Medicine | Admitting: Family Medicine

## 2019-07-19 ENCOUNTER — Other Ambulatory Visit: Payer: Self-pay | Admitting: Family Medicine

## 2019-07-19 ENCOUNTER — Other Ambulatory Visit: Payer: Self-pay

## 2019-07-19 DIAGNOSIS — I10 Essential (primary) hypertension: Secondary | ICD-10-CM | POA: Insufficient documentation

## 2019-07-19 DIAGNOSIS — I313 Pericardial effusion (noninflammatory): Secondary | ICD-10-CM | POA: Diagnosis present

## 2019-07-19 DIAGNOSIS — I3139 Other pericardial effusion (noninflammatory): Secondary | ICD-10-CM

## 2019-07-19 NOTE — Progress Notes (Signed)
  Echocardiogram 2D Echocardiogram has been performed.  Juan Grimes 07/19/2019, 1:26 PM

## 2019-07-24 ENCOUNTER — Other Ambulatory Visit: Payer: Self-pay

## 2019-07-24 ENCOUNTER — Ambulatory Visit (INDEPENDENT_AMBULATORY_CARE_PROVIDER_SITE_OTHER): Payer: Managed Care, Other (non HMO) | Admitting: Family Medicine

## 2019-07-24 ENCOUNTER — Encounter: Payer: Self-pay | Admitting: Family Medicine

## 2019-07-24 VITALS — BP 123/80 | HR 103 | Ht 72.64 in | Wt 350.6 lb

## 2019-07-24 DIAGNOSIS — R079 Chest pain, unspecified: Secondary | ICD-10-CM

## 2019-07-24 DIAGNOSIS — I10 Essential (primary) hypertension: Secondary | ICD-10-CM

## 2019-07-24 DIAGNOSIS — R0600 Dyspnea, unspecified: Secondary | ICD-10-CM | POA: Diagnosis not present

## 2019-07-24 DIAGNOSIS — I5189 Other ill-defined heart diseases: Secondary | ICD-10-CM

## 2019-07-24 DIAGNOSIS — R0609 Other forms of dyspnea: Secondary | ICD-10-CM

## 2019-07-24 NOTE — Assessment & Plan Note (Signed)
Improved since last visit status post increase in lisinopril.  -Continue lisinopril and HCTZ at current doses

## 2019-07-24 NOTE — Progress Notes (Signed)
    SUBJECTIVE:   CHIEF COMPLAINT / HPI:   Hypertension Improved since last visit after increasing lisinopril to 30 mg. Also taking HCTZ.  Tolerating medications fine.  Patient does have symptoms of left-sided chest pain (discussed below), dyspnea on exertion (discussed below), does not endorse headache, lower leg swelling  Left-sided chest pain  dyspnea on exertion  pericardial effusion Patient with continued left-sided chest pain.  It is improved since increasing his omeprazole for GERD.  He reports it is a constant pressure.  It is also associated with dyspnea with exertion.  Reports that dyspnea has improved as well.  Denies any cough, congestion, chest pain radiating to the left arm or jaw.  He has had 2 recent echoes in the past month.  They showed grade 1 diastolic dysfunction but with an EF of 60 to 65%.  Patient did have a pericardial effusion that was found on the first scan, but was not found to be hemodynamically significant.  A subsequent scan was obtained per recommendation of cardiology, which showed stable pericardial effusion without hemodynamic impact.  Patient not yet heard back from [facility] for his sleep study.  PERTINENT  PMH / PSH:  Patient does not smoke cigarettes.  OBJECTIVE:   BP 123/80   Pulse (!) 103   Ht 6' 0.64" (1.845 m)   Wt (!) 350 lb 9.6 oz (159 kg)   SpO2 99%   BMI 46.72 kg/m   Gen: NAD, resting comfortably CV: RRR with no murmurs appreciated, nontender to palpation Pulm: NWOB, CTAB with no crackles, wheezes, or rhonchi GI: epigastric tenderness Soft, Nontender, Nondistended. MSK: no cyanosis, or clubbing noted Skin: warm, dry Neuro: grossly normal, moves all extremities Psych: Normal affect and thought content   ASSESSMENT/PLAN:   Essential hypertension Improved since last visit status post increase in lisinopril.  -Continue lisinopril and HCTZ at current doses  Dyspnea on exertion Also associated with left-sided chest pain.  Has  improved since increasing omeprazole.  Work-up in ED was negative for acute MI month ago.  Echo x2 has not really shown any signs of acute heart failure. There is this pericardial effusion but appears to be hemodynamically stable, possibly causing the chest pain.  Although it does seem that the chest pain, is likely related to GERD.  Regarding the dyspnea, seems to be more likely OHS given his BMI, but will refer to cardiology for further work-up.  Patient was given the number for the sleep center at [facility] to ensure that his CPAP is titrated appropriately.  Patient follow-up in 1 to 2 months.  If work-up by cardiology is negative, recommend getting PFTs.     Bonnita Hollow, MD [place]

## 2019-07-24 NOTE — Assessment & Plan Note (Signed)
Also associated with left-sided chest pain.  Has improved since increasing omeprazole.  Work-up in ED was negative for acute MI month ago.  Echo x2 has not really shown any signs of acute heart failure. There is this pericardial effusion but appears to be hemodynamically stable, possibly causing the chest pain.  Although it does seem that the chest pain, is likely related to GERD.  Regarding the dyspnea, seems to be more likely OHS given his BMI, but will refer to cardiology for further work-up.  Patient was given the number for the sleep center at [facility] to ensure that his CPAP is titrated appropriately.  Patient follow-up in 1 to 2 months.  If work-up by cardiology is negative, recommend getting PFTs.

## 2019-07-24 NOTE — Patient Instructions (Addendum)
For your chest pain and shortness of breath, we are sending you to cardiology.   Please call the [facility] to schedule your appointment.  Address: [address], [place], [place] [address] Phone: [phone]  You may stop using the hydrocortisone cream for your chest rash.  Follow-up with me in 1 to 2 months to discuss what the cardiologist found.

## 2019-07-31 DIAGNOSIS — Z7189 Other specified counseling: Secondary | ICD-10-CM | POA: Insufficient documentation

## 2019-07-31 NOTE — Progress Notes (Signed)
Cardiology Office Note   Date:  08/01/2019   ID:  Juan Grimes, DOB 1985/07/25, MRN [id number]  PCP:  Juan Hollow, MD  Cardiologist:   No primary care provider on file. Referring:  Juan Hollow, MD  Chief Complaint  Patient presents with  . Chest Pain      History of Present Illness: Juan Grimes is a 34 y.o. male who is referred by Juan Hollow, MD for evaluation of DOE.   He reports this happened rather suddenly.  It was April 29.  He was dizzy at work.  He had chest pain.  He had 2 different kinds of discomfort.  One was a burning discomfort.  There was a left-sided sharp hurting.  This might have been somewhat positional.  He was somewhat short of breath.  He went to the emergency room.  I did review these records.  Troponin was negative.  I cannot pull up the EKG.  He was treated with a GI cocktail.  He said that the burning discomfort went away with that and with antacid.  He still having occasional sharp pain but it is getting better.  Is able to be at work.  Is not having any new overt shortness of breath, PND or orthopnea.  He is not having any new palpitations, presyncope or syncope.  He is dieting and trying to lose a little weight.  He has never had any prior cardiac work-up or history.  Past Medical History:  Diagnosis Date  . Bipolar 1 disorder, manic, moderate ([place])   . HTN (hypertension)    normal renin 2008  . Obese   . Plantar fasciitis   . Seizure [facility])    reported massive seizure in 2008    Past Surgical History:  Procedure Laterality Date  . TONSILECTOMY, ADENOIDECTOMY, BILATERAL MYRINGOTOMY AND TUBES     1996     Current Outpatient Medications  Medication Sig Dispense Refill  . diazepam (VALIUM) 10 MG tablet Take 10 mg by mouth as needed for anxiety.     . diphenhydrAMINE (BENADRYL) 25 mg capsule Take 1 capsule (25 mg total) by mouth every 6 (six) hours as needed. 30 capsule 0  . Glucosamine HCl (GLUCOSAMINE PO) Take 1  tablet by mouth at bedtime.    . hydrochlorothiazide (HYDRODIURIL) 25 MG tablet Take 1 tablet by mouth once daily (Patient taking differently: Take 25 mg by mouth at bedtime. ) 90 tablet 0  . ipratropium (ATROVENT) 0.06 % nasal spray Place 2 sprays into both nostrils 4 (four) times daily. (Patient taking differently: Place 2 sprays into both nostrils as needed for rhinitis. ) 15 mL 12  . lamoTRIgine (LAMICTAL) 200 MG tablet Take 400 mg by mouth at bedtime.     Marland Kitchen lisinopril (ZESTRIL) 30 MG tablet Take 1 tablet (30 mg total) by mouth daily. 30 tablet 3  . Multiple Vitamin (MULTIVITAMIN ADULT PO) Take 1 tablet by mouth at bedtime.    Marland Kitchen omeprazole (PRILOSEC) 40 MG capsule Take 1 capsule (40 mg total) by mouth daily. 30 capsule 0  . QUEtiapine (SEROQUEL) 200 MG tablet Take 200 mg by mouth at bedtime.    . traZODone (DESYREL) 100 MG tablet Take 200 mg by mouth at bedtime as needed for sleep.     No current facility-administered medications for this visit.    Allergies:   Patient has no known allergies.    Social History:  The patient  reports that he has  never smoked. His smokeless tobacco use includes snuff. He reports current alcohol use of about 2.0 standard drinks of alcohol per week. He reports that he does not use drugs.   Family History:  The patient's family history includes Bipolar disorder in his father; Fibromyalgia in his mother; Hypertension in his father.    ROS:  Please see the history of present illness.   Otherwise, review of systems are positive for none.   All other systems are reviewed and negative.    PHYSICAL EXAM: VS:  BP (!) 125/93   Pulse 92   Temp 97.7 F (36.5 C)   Ht 6\' 1"  (1.854 m)   Wt (!) 345 lb (156.5 kg)   SpO2 94%   BMI 45.52 kg/m  , BMI Body mass index is 45.52 kg/m. GENERAL:  Well appearing HEENT:  Pupils equal round and reactive, fundi not visualized, oral mucosa unremarkable NECK:  No jugular venous distention, waveform within normal limits,  carotid upstroke brisk and symmetric, no bruits, no thyromegaly LYMPHATICS:  No cervical, inguinal adenopathy LUNGS:  Clear to auscultation bilaterally BACK:  No CVA tenderness CHEST:  Unremarkable HEART:  PMI not displaced or sustained,S1 and S2 within normal limits, no S3, no S4, no clicks, no rubs, no murmurs ABD:  Flat, positive bowel sounds normal in frequency in pitch, no bruits, no rebound, no guarding, no midline pulsatile mass, no hepatomegaly, no splenomegaly EXT:  2 plus pulses throughout, no edema, no cyanosis no clubbing SKIN:  No rashes no nodules NEURO:  Cranial nerves II through XII grossly intact, motor grossly intact throughout PSYCH:  Cognitively intact, oriented to person place and time    EKG:  EKG is ordered today. The ekg ordered today demonstrates sinus rhythm, rate 92, axis within normal limits, intervals within normal limits, no acute ST-T wave changes.   Recent Labs: 07/04/2019: ALT 44; BUN 14; Creatinine, Ser 0.92; Hemoglobin 15.2; Platelets 216; Potassium 3.9; Sodium 139    Lipid Panel    Component Value Date/Time   CHOL 122 01/19/2016 1645   TRIG 73 01/19/2016 1645   HDL 30 (L) 01/19/2016 1645   CHOLHDL 4.1 01/19/2016 1645   VLDL 15 01/19/2016 1645   LDLCALC 77 01/19/2016 1645   LDLDIRECT 121 (H) 03/24/2011 1636      Wt Readings from Last 3 Encounters:  08/01/19 (!) 345 lb (156.5 kg)  07/24/19 (!) 350 lb 9.6 oz (159 kg)  07/10/19 (!) 350 lb 8 oz (159 kg)      Other studies Reviewed: Additional studies/ records that were reviewed today include: ED records. Review of the above records demonstrates:  Please see elsewhere in the note.     ASSESSMENT AND PLAN:  CHEST PAIN:   Patient's chest pain was somewhat atypical.  He could have had pericarditis but he is getting better.  I am not planning on treating him unless he has increasing symptoms.   MORBID OBESITY: We had a long discussion about this was specifics for diet.  PERICARDIAL  EFFUSION: I suspect this is a reactive or inflammatory small effusion.  Nothing needs to be done about it now but I will follow up with an echocardiogram in about 4 months.  SLEEP APNEA: He has a history of sleep apnea and is due to have a new mask.  COVID EDUCATION: I had a discussion with him about the vaccine.  Current medicines are reviewed at length with the patient today.  The patient does not have concerns regarding medicines.  The  following changes have been made:  no change  Labs/ tests ordered today include:   Orders Placed This Encounter  Procedures  . EKG 12-Lead  . ECHOCARDIOGRAM COMPLETE     Disposition:   FU with me as needed.      Signed, Minus Breeding, MD  08/01/2019 10:44 PM    [place]

## 2019-08-01 ENCOUNTER — Encounter: Payer: Self-pay | Admitting: Cardiology

## 2019-08-01 ENCOUNTER — Ambulatory Visit (INDEPENDENT_AMBULATORY_CARE_PROVIDER_SITE_OTHER): Payer: Managed Care, Other (non HMO) | Admitting: Cardiology

## 2019-08-01 ENCOUNTER — Other Ambulatory Visit: Payer: Self-pay

## 2019-08-01 VITALS — BP 125/93 | HR 92 | Temp 97.7°F | Ht 73.0 in | Wt 345.0 lb

## 2019-08-01 DIAGNOSIS — Z7189 Other specified counseling: Secondary | ICD-10-CM | POA: Diagnosis not present

## 2019-08-01 DIAGNOSIS — R0600 Dyspnea, unspecified: Secondary | ICD-10-CM | POA: Diagnosis not present

## 2019-08-01 DIAGNOSIS — R0609 Other forms of dyspnea: Secondary | ICD-10-CM

## 2019-08-01 NOTE — Patient Instructions (Addendum)
Medication Instructions:  NO CHANGES *If you need a refill on your cardiac medications before your next appointment, please call your pharmacy*  Lab Work: NONE ORDERED THIS VISIT  Testing/Procedures: Your physician has requested that you have an echocardiogram IN 4 MONTHS. Echocardiography is a painless test that uses sound waves to create images of your heart. It provides your doctor with information about the size and shape of your heart and how well your heart's chambers and valves are working. This procedure takes approximately one hour. There are no restrictions for this procedure. [place]  Follow-Up: At [facility], you and your health needs are our priority.  As part of our continuing mission to provide you with exceptional heart care, we have created designated Provider Care Teams.  These Care Teams include your primary Cardiologist (physician) and Advanced Practice Providers (APPs -  Physician Assistants and Nurse Practitioners) who all work together to provide you with the care you need, when you need it.  Your next appointment:   FOLLOW UP AS NEEDED

## 2019-08-12 ENCOUNTER — Other Ambulatory Visit: Payer: Self-pay | Admitting: Family Medicine

## 2019-08-12 DIAGNOSIS — K219 Gastro-esophageal reflux disease without esophagitis: Secondary | ICD-10-CM

## 2019-08-20 ENCOUNTER — Other Ambulatory Visit: Payer: Self-pay | Admitting: Family Medicine

## 2019-08-20 DIAGNOSIS — G4733 Obstructive sleep apnea (adult) (pediatric): Secondary | ICD-10-CM

## 2019-08-20 NOTE — Progress Notes (Signed)
Patient with h/o OSA. Sleep study denied by insurance. Will refer to Pulmonology for further assistance.

## 2019-08-30 ENCOUNTER — Other Ambulatory Visit: Payer: Self-pay

## 2019-08-30 ENCOUNTER — Ambulatory Visit (INDEPENDENT_AMBULATORY_CARE_PROVIDER_SITE_OTHER): Payer: Managed Care, Other (non HMO) | Admitting: Family Medicine

## 2019-08-30 ENCOUNTER — Encounter: Payer: Self-pay | Admitting: Family Medicine

## 2019-08-30 VITALS — BP 120/66 | HR 92 | Ht 73.0 in | Wt 345.5 lb

## 2019-08-30 DIAGNOSIS — R079 Chest pain, unspecified: Secondary | ICD-10-CM | POA: Diagnosis not present

## 2019-08-30 DIAGNOSIS — R0609 Other forms of dyspnea: Secondary | ICD-10-CM

## 2019-08-30 DIAGNOSIS — R0600 Dyspnea, unspecified: Secondary | ICD-10-CM | POA: Diagnosis not present

## 2019-08-30 NOTE — Progress Notes (Signed)
° ° °  SUBJECTIVE:   CHIEF COMPLAINT / HPI:   F/U on SOB and CP  SOB  Getting better. No trigger. Worse when getting hot or moving.  CP Not as frequent. Randomly occurs. Occurs 3 times a week. Acid reflux pill did help, omeproze 40 mg. Wants to stay where hes at. Recently seen by [facility]. They felt CP was atypical, possibly pericarditis. The counseled on weight management. For his recent pericardial effusion, cardiology wants to check an echocardiogram in 4 months.   Obesity Patient states that he is actively trying to loose weight. Finding it hard. Has tried to stop junk food, but still gains weight. He has never tried medications or seen nutrition. Discussed treatment options. Patient wants to wait on anything.   Risk stratification labs for cardiac disease Patient is due for lipid panel.  But he does not want to get lipid panel or labs today.   Tobacco abuse Patient uses snuff tobacco.  He does not smoke.  He is not interested in quitting at this time  PERTINENT  PMH / PSH: Bipolar.  Patient was recently started on Latuda by his psychiatrist.  OBJECTIVE:   BP 120/66    Pulse 92    Ht 6\' 1"  (1.854 m)    Wt (!) 345 lb 8 oz (156.7 kg)    SpO2 98%    BMI 45.58 kg/m   Gen: NAD, resting comfortably CV: RRR with no murmurs appreciated Pulm: NWOB, CTAB with no crackles, wheezes, or rhonchi GI: Normal bowel sounds present. Soft, Nontender, Nondistended. MSK: no edema, cyanosis, or clubbing noted Skin: warm, dry Neuro: grossly normal, moves all extremities   ASSESSMENT/PLAN:   Dyspnea on exertion Improved. Likely due to deconditioning vs. OHS. Patient w/ CPAP at night, needing a new one. He was already referred to pulmonology for a sleep study. Patient does not want to do PFTs at this time. Return precautions discussed. -Follow-up as needed  Chest pain Unrelated to dyspnea on exertion.  Has seen cardiology, they plan to follow-up with an echo in 4 months.  Overall  he felt it was atypical, possibly pericarditis, but low risk for heart failure or myocardial ischemia disease.  Obesity, morbid (BMI > 40) Counseled on lifestyle modifications, medications, surgical options.  Patient is not interested in these at this time.     Bonnita Hollow, MD [place]

## 2019-08-30 NOTE — Assessment & Plan Note (Signed)
Unrelated to dyspnea on exertion.  Has seen cardiology, they plan to follow-up with an echo in 4 months.  Overall he felt it was atypical, possibly pericarditis, but low risk for heart failure or myocardial ischemia disease.

## 2019-08-30 NOTE — Assessment & Plan Note (Signed)
Counseled on lifestyle modifications, medications, surgical options.  Patient is not interested in these at this time.

## 2019-08-30 NOTE — Assessment & Plan Note (Signed)
Improved. Likely due to deconditioning vs. OHS. Patient w/ CPAP at night, needing a new one. He was already referred to pulmonology for a sleep study. Patient does not want to do PFTs at this time. Return precautions discussed. -Follow-up as needed

## 2019-09-17 ENCOUNTER — Other Ambulatory Visit: Payer: Self-pay | Admitting: Family Medicine

## 2019-09-17 DIAGNOSIS — K219 Gastro-esophageal reflux disease without esophagitis: Secondary | ICD-10-CM

## 2019-10-09 ENCOUNTER — Other Ambulatory Visit: Payer: Self-pay

## 2019-10-09 DIAGNOSIS — I10 Essential (primary) hypertension: Secondary | ICD-10-CM

## 2019-10-11 MED ORDER — HYDROCHLOROTHIAZIDE 25 MG PO TABS: 25.0000 mg | ORAL_TABLET | Freq: Every day | ORAL | 1 refills | Status: DC

## 2019-11-19 ENCOUNTER — Other Ambulatory Visit: Payer: Self-pay

## 2019-11-19 DIAGNOSIS — I10 Essential (primary) hypertension: Secondary | ICD-10-CM

## 2019-11-20 MED ORDER — LISINOPRIL 30 MG PO TABS: 30.0000 mg | ORAL_TABLET | Freq: Every day | ORAL | 6 refills | Status: DC

## 2019-12-06 ENCOUNTER — Other Ambulatory Visit: Payer: Self-pay

## 2019-12-06 ENCOUNTER — Ambulatory Visit (HOSPITAL_COMMUNITY): Payer: Managed Care, Other (non HMO) | Attending: Cardiology

## 2019-12-06 DIAGNOSIS — R0609 Other forms of dyspnea: Secondary | ICD-10-CM

## 2019-12-06 DIAGNOSIS — R0600 Dyspnea, unspecified: Secondary | ICD-10-CM

## 2019-12-06 LAB — ECHOCARDIOGRAM COMPLETE
Area-P 1/2: 1.62 cm2
S' Lateral: 3.3 cm

## 2019-12-19 ENCOUNTER — Other Ambulatory Visit: Payer: Self-pay

## 2019-12-19 ENCOUNTER — Encounter: Payer: Self-pay | Admitting: Pulmonary Disease

## 2019-12-19 ENCOUNTER — Ambulatory Visit (INDEPENDENT_AMBULATORY_CARE_PROVIDER_SITE_OTHER): Payer: Managed Care, Other (non HMO) | Admitting: Pulmonary Disease

## 2019-12-19 DIAGNOSIS — G4733 Obstructive sleep apnea (adult) (pediatric): Secondary | ICD-10-CM | POA: Diagnosis not present

## 2019-12-19 DIAGNOSIS — Z9989 Dependence on other enabling machines and devices: Secondary | ICD-10-CM

## 2019-12-19 NOTE — Progress Notes (Signed)
Subjective:    Patient ID: Juan Grimes, male    DOB: [birth year], 34 y.o.   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  PMH -hypertension, obesity, bipolar disorder.  He was diagnosed in 2008 by a sleep study that showed moderate OSA.  He was started on a Respironics C-Flex machine at 15 cm with a full facemask with good improvement in his daytime somnolence and fatigue.  He reports good compliance with his machine.  No snoring has been noted by his family members.  He lives with his parents.  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  Unfortunately he has gained about 70 pounds since that study to his current weight of 329 pounds. Epworth sleepiness score is 1. Bedtime is between 9 and 10 PM, sleep latency is 15 minutes, he sleeps on his side with 2 pillows, reports 2-3 nocturnal awakenings including nocturia and is out of bed at 6 AM feeling rested without dryness of mouth or headaches. He denies excessive caffeinated beverages.  He has been obtaining his supplies online.  Inspection of his machine shows dust over the machine and dust within the filter, no SD card was noted  Labs reviewed, serum bicarbonate varies from 24-27 Significant tests/ events reviewed  06/2006 NPSG >> AHI 14/hour 10/2006 - 247 lbs -CPAP 15 cm  Past Medical History:  Diagnosis Date  . Bipolar 1 disorder, manic, moderate ([place])   . HTN (hypertension)    normal renin 2008  . Obese   . Plantar fasciitis   . Seizure [facility])    reported massive seizure in 2008   Past Surgical History:  Procedure Laterality Date  . TONSILECTOMY, ADENOIDECTOMY, BILATERAL MYRINGOTOMY AND TUBES     1996    No Known Allergies  Social History   Socioeconomic History  . Marital status: Single    Spouse name: Not on file  . Number of children: Not on file  . Years of education:  Not on file  . Highest education level: Not on file  Occupational History  . Not on file  Tobacco Use  . Smoking status: Never Smoker  . Smokeless tobacco: Current User    Types: Snuff  . Tobacco comment: patches   Substance and Sexual Activity  . Alcohol use: Yes    Alcohol/week: 2.0 standard drinks    Types: 2 Standard drinks or equivalent per week  . Drug use: No  . Sexual activity: Not on file  Other Topics Concern  . Not on file  Social History Narrative   Lives with mother and father. Never married. 4 dogs and a fish.       Employed at [employer] 30 hrs/wk. Did not finish high school.      Hobbies: books, video games, backpacking, fishing      Sometimes has difficulty understanding medical information.   Social Determinants of Health   Financial Resource Strain:   . Difficulty of Paying Living Expenses: Not on file  Food Insecurity:   . Worried About Charity fundraiser in the Last Year: Not on file  . Ran Out of Food in the Last Year: Not on file  Transportation Needs:   . Lack of Transportation (Medical): Not on file  . Lack of Transportation (Non-Medical): Not on file  Physical Activity:   . Days of Exercise per Week: Not  on file  . Minutes of Exercise per Session: Not on file  Stress:   . Feeling of Stress : Not on file  Social Connections:   . Frequency of Communication with Friends and Family: Not on file  . Frequency of Social Gatherings with Friends and Family: Not on file  . Attends Religious Services: Not on file  . Active Member of Clubs or Organizations: Not on file  . Attends Archivist Meetings: Not on file  . Marital Status: Not on file  Intimate Partner Violence:   . Fear of Current or Ex-Partner: Not on file  . Emotionally Abused: Not on file  . Physically Abused: Not on file  . Sexually Abused: Not on file  '  Family History  Problem Relation Age of Onset  . Bipolar disorder Father   . Hypertension Father   . Fibromyalgia Mother        Review of Systems Acid heartburn Depression No daytime somnolence or fatigue No dyspnea or cough No chest pain, orthopnea or proximal nocturnal dyspnea No pedal edema    Objective:   Physical Exam  Gen. Pleasant, obese, in no distress, normal affect ENT - no pallor,icterus, no post nasal drip, class 2-3 airway Neck: No JVD, no thyromegaly, no carotid bruits Lungs: no use of accessory muscles, no dullness to percussion, decreased without rales or rhonchi  Cardiovascular: Rhythm regular, heart sounds  normal, no murmurs or gallops, no peripheral edema Abdomen: soft and non-tender, no hepatosplenomegaly, BS normal. Musculoskeletal: No deformities, no cyanosis or clubbing Neuro:  alert, non focal, no tremors       Assessment & Plan:

## 2019-12-19 NOTE — Assessment & Plan Note (Signed)
Weight loss encouraged. Serum bicarbonate is 24-27 range so I doubt that he has obesity hypoventilation

## 2019-12-19 NOTE — Patient Instructions (Signed)
Prescription for auto CPAP 10 to 20 cm with large AirFit F30 full facemask will be sent to DME We will schedule follow-up in 1 month so that we can review report and tweak settings if required

## 2019-12-19 NOTE — Assessment & Plan Note (Signed)
He is maintained on 15 cm, good compliance by report.  His machine is very old and unable to get download from the machine.  By history he is using this religiously.  CPAP is certainly helped improve his daytime somnolence and fatigue. I really do not feel a repeat sleep study is necessary.  He has gained 70 pounds since his original sleep study in 2008 which showed moderate OSA.  He likely has increased severity now Prescription for auto CPAP 10 to 20 cm with large AirFit F30 full facemask will be sent to DME We will schedule follow-up in 1 month so that we can review report and tweak settings if required  Weight loss encouraged, compliance with goal of at least 4-6 hrs every night is the expectation. Advised against medications with sedative side effects Cautioned against driving when sleepy - understanding that sleepiness will vary on a day to day basis

## 2019-12-26 ENCOUNTER — Encounter: Payer: Self-pay | Admitting: Family Medicine

## 2019-12-26 ENCOUNTER — Ambulatory Visit (INDEPENDENT_AMBULATORY_CARE_PROVIDER_SITE_OTHER): Payer: Managed Care, Other (non HMO) | Admitting: Family Medicine

## 2019-12-26 ENCOUNTER — Other Ambulatory Visit: Payer: Self-pay

## 2019-12-26 VITALS — BP 124/60 | HR 107 | Ht 72.0 in | Wt 331.2 lb

## 2019-12-26 DIAGNOSIS — K5900 Constipation, unspecified: Secondary | ICD-10-CM

## 2019-12-26 DIAGNOSIS — K219 Gastro-esophageal reflux disease without esophagitis: Secondary | ICD-10-CM

## 2019-12-26 DIAGNOSIS — I10 Essential (primary) hypertension: Secondary | ICD-10-CM

## 2019-12-26 MED ORDER — POLYETHYLENE GLYCOL 3350 17 GM/SCOOP PO POWD
17.0000 g | Freq: Two times a day (BID) | ORAL | 1 refills | Status: DC | PRN
Start: 2019-12-26 — End: 2023-08-09

## 2019-12-26 NOTE — Patient Instructions (Signed)
It was so great meeting you today!  I am sorry that you have not been feeling well recently, I have placed a referral to the gastroenterologist. They should be in contact with you soon to schedule an appointment.  For your constipation, I have prescribed miralax. Please take a capful daily as needed. If you are having more bowel movements than typical than you can adjust dosing to 1/2 cap as needed. This will help you stay regular. Please make sure to eat plenty of fruits and vegetables. Continue to drink ample water throughout the day to ensure that you are hydrating appropriately.   Thank you for allowing [place] to be a part of your medical care!

## 2019-12-26 NOTE — Assessment & Plan Note (Signed)
-  miralax  -importance of maintaining appropriate hydration and high fiber diet

## 2019-12-26 NOTE — Progress Notes (Signed)
    SUBJECTIVE:   CHIEF COMPLAINT / HPI:   Patient presents with 2 week history of epigastric pain, rates pain 4/10 that only occurs after lunch. Endorses nausea and multiple episodes of vomiting that usually resolves by dinner time. Reports having bilious emesis at times. No changes in dietary habits, usually eats leftovers from dinner including pork chops, pasta and hamburger. No recent travel history. Stays hydrated by drinking at least 3-4 bottles a day. Denies prior EGD. Denies fever, chills, hematemsis and diarrhea. Admits to fatigue after these episodes. Only change is that patient stopped seroquel 2 weeks ago. Patient has history of GERD and compliant with omeprazole 40 mg daily. Denies any reflux sensation. Denies any abdominal surgeries or recent hospitalizations. Drinks alcohol occasionally every weekend when he was a beer or cocktail but states he hasn't even felt like drinking over the weekend.    Constipation Endorses constipation since 2 weeks ago. Typically has 2-3 BMs a day but has not been having a BM recently until taking milk of magnesium which helped initially and now he is back to being constipation.   PERTINENT  PMH / PSH:   HTN Denies chest pain and dyspnea. Patient compliant with lisinopril 30 mg daily. BP in the office 126/60.   OBJECTIVE:   BP 124/60   Pulse (!) 107   Ht 6' (1.829 m)   Wt (!) 331 lb 3.2 oz (150.2 kg)   SpO2 97%   BMI 44.92 kg/m   General: Patient well-appearing, in no acute distress. CV: RRR, no murmurs or gallops auscultated  Resp: lungs clear to auscultation bilaterally, breathing comfortably on room air Abdomen: soft, tender upon deep palpation of the epigastric region, presence of active bowel sounds, nondistended Ext: radial pulses strong and equal bilaterally, no LE edema noted Derm: skin warm and dry to touch, no rashes or lesions noted Neuro: ambulates without assistance of limitation   ASSESSMENT/PLAN:   Gastroesophageal reflux  disease -continue omeprazole 40 mg -lifestyle modifications reviewed including avoiding eating right before bed  -Given that patient is already on high dose PPI and potentially bilious emesis, GI referral placed. Likely worsening GERD from poor dietary habits or concern for gastritis since emesis associated with eating. Potential EGD since patient has never had one before.    Constipation -miralax  -importance of maintaining appropriate hydration and high fiber diet   HTN (hypertension) -BP in the office appropriate -continue lisinopril  -lifestyle modifications discussed including the importance of incorporating fruits and vegetables into diet      Darrion Macaulay Larae Grooms, DO [place]

## 2019-12-26 NOTE — Assessment & Plan Note (Signed)
-  BP in the office appropriate -continue lisinopril  -lifestyle modifications discussed including the importance of incorporating fruits and vegetables into diet

## 2019-12-26 NOTE — Assessment & Plan Note (Signed)
-  continue omeprazole 40 mg -lifestyle modifications reviewed including avoiding eating right before bed  -Given that patient is already on high dose PPI and potentially bilious emesis, GI referral placed. Likely worsening GERD from poor dietary habits or concern for gastritis since emesis associated with eating. Potential EGD since patient has never had one before.

## 2019-12-30 ENCOUNTER — Other Ambulatory Visit: Payer: Self-pay | Admitting: Family Medicine

## 2019-12-30 DIAGNOSIS — K219 Gastro-esophageal reflux disease without esophagitis: Secondary | ICD-10-CM

## 2019-12-31 ENCOUNTER — Telehealth: Payer: Self-pay | Admitting: *Deleted

## 2019-12-31 NOTE — Telephone Encounter (Signed)
LM for patient to call back.  Need to schedule his lab appt and also see what days/times work best for an ultrasound.  Will try patient again later today.  Erlin Gardella,CMA

## 2019-12-31 NOTE — Telephone Encounter (Signed)
-----   Message from Donney Dice, DO sent at 12/30/2019  5:52 PM EDT ----- Regarding: lab visit Would like patient to come in for lab visit for CMP and CBC. Also ordered RUQ [place] in addition to prior GI referral placed. Appreciate assistance with scheduling. Thank you!

## 2020-01-02 NOTE — Telephone Encounter (Signed)
Spoke with pt informed him of his U/Sound and labs on 11/5. His U/Sound is at 9:00 at GI and his labs are here at the office at 10:00. Pt understood. Salvatore Marvel, CMA

## 2020-01-02 NOTE — Telephone Encounter (Signed)
Spoke with pt. He stated that for his labs and ultrasound  any day in the afternoon next week will work. Salvatore Marvel, CMA

## 2020-01-10 ENCOUNTER — Ambulatory Visit
Admission: RE | Admit: 2020-01-10 | Discharge: 2020-01-10 | Disposition: A | Discharge status: Home / Self Care | Payer: Managed Care, Other (non HMO) | Source: Ambulatory Visit | Attending: Family Medicine | Admitting: Family Medicine

## 2020-01-10 ENCOUNTER — Other Ambulatory Visit: Payer: Managed Care, Other (non HMO)

## 2020-01-10 ENCOUNTER — Other Ambulatory Visit: Payer: Self-pay

## 2020-01-10 DIAGNOSIS — K219 Gastro-esophageal reflux disease without esophagitis: Secondary | ICD-10-CM

## 2020-01-23 ENCOUNTER — Other Ambulatory Visit: Payer: Self-pay

## 2020-01-23 ENCOUNTER — Emergency Department (HOSPITAL_COMMUNITY)
Admission: EM | Admit: 2020-01-23 | Discharge: 2020-01-23 | Disposition: A | Discharge status: Home / Self Care | Payer: Managed Care, Other (non HMO) | Attending: Emergency Medicine | Admitting: Emergency Medicine

## 2020-01-23 ENCOUNTER — Encounter (HOSPITAL_COMMUNITY): Payer: Self-pay | Admitting: Emergency Medicine

## 2020-01-23 DIAGNOSIS — Z79811 Long term (current) use of aromatase inhibitors: Secondary | ICD-10-CM | POA: Insufficient documentation

## 2020-01-23 DIAGNOSIS — R220 Localized swelling, mass and lump, head: Secondary | ICD-10-CM

## 2020-01-23 DIAGNOSIS — I10 Essential (primary) hypertension: Secondary | ICD-10-CM | POA: Diagnosis not present

## 2020-01-23 MED ORDER — CLINDAMYCIN HCL 150 MG PO CAPS: 300.0000 mg | ORAL_CAPSULE | Freq: Three times a day (TID) | ORAL | 0 refills | Status: AC

## 2020-01-23 NOTE — Discharge Instructions (Addendum)
Please pick up antibiotics and take as prescribed Follow up with your dentist regarding your ED visit as it is likely the facial swelling is coming from your teeth. I have attached a dental resource guide.  Apply warm compresses to your face. You can take 800 mg Ibuprofen every 8 hours and 1,000 mg Tylenol every 8 hours as needed for pain (recommended to alternate every 4 hours).   Return to the ED IMMEDIATELY for any worsening symptoms including worsening swelling, inability to open jaw all the way, foul taste in mouth, fevers > 100.4, or any other new/concerning symptoms

## 2020-01-23 NOTE — ED Triage Notes (Signed)
Pt reports abscess to left side of face for 2 days. Left side of face swollen. No SOB.

## 2020-01-23 NOTE — ED Provider Notes (Signed)
[place] EMERGENCY DEPARTMENT Provider Note   CSN: [id number] Arrival date & time: 01/23/20  1449     History Chief Complaint  Patient presents with  . Abscess    Juan Grimes is a 34 y.o. male who presents to the ED today with complaint of gradual onset, constant, swelling, pain, and redness to the left side of his face for the past 2 days. Pt reports hx of dental abscess in the past however he feels like this is mostly on the outside of his face vs in his mouth. He denies any dental pain. No foul taste in mouth. No fevers or chills. He has been taking Ibuprofen without much relief. Pt does not currently have a dentist but plans to find one soon. No other complaints at this time including trismus, drooling, sore throat, or any other associated symptoms.   The history is provided by the patient and medical records.       Past Medical History:  Diagnosis Date  . Bipolar 1 disorder, manic, moderate ([place])   . HTN (hypertension)    normal renin 2008  . Obese   . Plantar fasciitis   . Seizure [facility])    reported massive seizure in 2008    Patient Active Problem List   Diagnosis Date Noted  . Constipation 12/26/2019  . Chest pain 08/30/2019  . Educated about COVID-19 virus infection 07/31/2019  . Essential hypertension 07/24/2019  . Dyspnea on exertion 07/10/2019  . Gastroesophageal reflux disease 07/10/2019  . Rash and nonspecific skin eruption 07/10/2014  . OSA on CPAP 11/25/2010  . History of Seizure 11/25/2010  . HTN (hypertension) 11/15/2010  . Obesity, morbid (BMI > 40) 11/15/2010  . Bipolar I disorder ([place]) 11/15/2010    Past Surgical History:  Procedure Laterality Date  . TONSILECTOMY, ADENOIDECTOMY, BILATERAL MYRINGOTOMY AND TUBES     1996       Family History  Problem Relation Age of Onset  . Bipolar disorder Father   . Hypertension Father   . Fibromyalgia Mother     Social History   Tobacco Use  . Smoking status: Never  Smoker  . Smokeless tobacco: Current User    Types: Snuff  . Tobacco comment: patches   Substance Use Topics  . Alcohol use: Yes    Alcohol/week: 2.0 standard drinks    Types: 2 Standard drinks or equivalent per week  . Drug use: No    Home Medications Prior to Admission medications   Medication Sig Start Date End Date Taking? Authorizing Provider  clindamycin (CLEOCIN) 150 MG capsule Take 2 capsules (300 mg total) by mouth 3 (three) times daily for 7 days. 01/23/20 01/30/20  Alroy Bailiff, Desiray Orchard, PA-C  diazepam (VALIUM) 10 MG tablet Take 10 mg by mouth as needed for anxiety.  02/07/19   [provider]  diphenhydrAMINE (BENADRYL) 25 mg capsule Take 1 capsule (25 mg total) by mouth every 6 (six) hours as needed. 05/18/14   Britt Bottom, NP  Glucosamine HCl (GLUCOSAMINE PO) Take 1 tablet by mouth at bedtime.    [provider]  hydrochlorothiazide (HYDRODIURIL) 25 MG tablet Take 1 tablet (25 mg total) by mouth daily. 10/11/19   Ganta, Anupa, DO  lamoTRIgine (LAMICTAL) 200 MG tablet Take 400 mg by mouth at bedtime.  04/06/16   [provider]  LATUDA 40 MG TABS tablet Take 40 mg by mouth daily. 11/18/19   [provider]  lisinopril (ZESTRIL) 30 MG tablet Take 1 tablet (30 mg  total) by mouth daily. 11/20/19 12/26/19  Donney Dice, DO  Multiple Vitamin (MULTIVITAMIN ADULT PO) Take 1 tablet by mouth at bedtime.    [provider]  omeprazole (PRILOSEC) 40 MG capsule Take 1 capsule by mouth once daily 09/20/19   Ganta, Anupa, DO  polyethylene glycol powder (GLYCOLAX/MIRALAX) 17 GM/SCOOP powder Take 17 g by mouth 2 (two) times daily as needed for moderate constipation. 12/26/19   Donney Dice, DO    Allergies    Patient has no known allergies.  Review of Systems   Review of Systems  Constitutional: Negative for chills and fever.  HENT: Positive for facial swelling. Negative for dental problem, drooling, ear pain, sore throat, trouble swallowing and  voice change.     Physical Exam Updated Vital Signs BP 125/86   Pulse 89   Temp 98.6 F (37 C) (Oral)   Resp 18   Ht 6' (1.829 m)   Wt (!) 149.7 kg   SpO2 99%   BMI 44.76 kg/m   Physical Exam Vitals and nursing note reviewed.  Constitutional:      Appearance: He is not ill-appearing.  HENT:     Head: Atraumatic.     Right Ear: Tympanic membrane normal.     Left Ear: Tympanic membrane normal.     Mouth/Throat:     Comments: Mild left sided facial swelling along mandible with TTP; area appears indurated without obvious fluctuance.  Multiple extracted teeth throughout gumline as well as dental caries specifically along the left lower jaw line. No definitive abscess to be drained along the mucosa. No trismus. No sings of ludwig's angina.  Eyes:     Conjunctiva/sclera: Conjunctivae normal.  Cardiovascular:     Rate and Rhythm: Normal rate and regular rhythm.     Pulses: Normal pulses.  Pulmonary:     Effort: Pulmonary effort is normal.     Breath sounds: Normal breath sounds. No wheezing, rhonchi or rales.  Skin:    General: Skin is warm and dry.     Coloration: Skin is not jaundiced.  Neurological:     Mental Status: He is alert.     ED Results / Procedures / Treatments   Labs (all labs ordered are listed, but only abnormal results are displayed) Labs Reviewed - No data to display  EKG None  Radiology No results found.  Procedures Procedures (including critical care time)  Medications Ordered in ED Medications - No data to display  ED Course  I have reviewed the triage vital signs and the nursing notes.  Pertinent labs & imaging results that were available during my care of the patient were reviewed by me and considered in my medical decision making (see chart for details).    MDM Rules/Calculators/A&P                          34 year old male presenting to the ED today with complaint of left sided facial swelling x 2 days with concern for abscess. On  arrival to the ED VSS. Pt is afebrile, nontachycardic, and nontachypneic. He appears to be in NAD. There is a mild amount of swelling noted to the lower jawline with TTP; area appears indurated without signs of fluctuance today. Oral exam with findings of multiple missing teeth and dental caries specifically to the left lower gumline. There is no obvious abscess to be drained today however suspect swelling is secondary to dental caries. No signs of ludwig's angina.  Have discussed antibiotics today and warm compresses to the area with close follow up with dentist. Will provide dental resource guide for patient. He is instructed to return to the ED IMMEDIATELY for any worsening symptoms. He is in agreement with plan and stable for discharge home.   This note was prepared using Dragon voice recognition software and may include unintentional dictation errors due to the inherent limitations of voice recognition software.  Final Clinical Impression(s) / ED Diagnoses Final diagnoses:  Facial swelling    Rx / DC Orders ED Discharge Orders         Ordered    clindamycin (CLEOCIN) 150 MG capsule  3 times daily        01/23/20 1655           Discharge Instructions     Please pick up antibiotics and take as prescribed Follow up with your dentist regarding your ED visit as it is likely the facial swelling is coming from your teeth. I have attached a dental resource guide.  Apply warm compresses to your face. You can take 800 mg Ibuprofen every 8 hours and 1,000 mg Tylenol every 8 hours as needed for pain (recommended to alternate every 4 hours).   Return to the ED IMMEDIATELY for any worsening symptoms including worsening swelling, inability to open jaw all the way, foul taste in mouth, fevers > 100.4, or any other new/concerning symptoms       Eustaquio Maize, PA-C 01/23/20 1656    Lucrezia Starch, MD 01/24/20 1701

## 2020-02-04 ENCOUNTER — Encounter: Payer: Self-pay | Admitting: Gastroenterology

## 2020-02-18 ENCOUNTER — Ambulatory Visit: Payer: Managed Care, Other (non HMO) | Admitting: Pulmonary Disease

## 2020-02-18 ENCOUNTER — Other Ambulatory Visit: Payer: Self-pay | Admitting: Family Medicine

## 2020-02-18 DIAGNOSIS — K219 Gastro-esophageal reflux disease without esophagitis: Secondary | ICD-10-CM

## 2020-03-07 HISTORY — PX: COLONOSCOPY: SHX174

## 2020-03-17 ENCOUNTER — Other Ambulatory Visit (INDEPENDENT_AMBULATORY_CARE_PROVIDER_SITE_OTHER): Payer: Managed Care, Other (non HMO)

## 2020-03-17 ENCOUNTER — Encounter: Payer: Self-pay | Admitting: Gastroenterology

## 2020-03-17 ENCOUNTER — Ambulatory Visit (INDEPENDENT_AMBULATORY_CARE_PROVIDER_SITE_OTHER): Payer: Managed Care, Other (non HMO) | Admitting: Gastroenterology

## 2020-03-17 VITALS — BP 130/82 | HR 82 | Ht 72.0 in | Wt 315.0 lb

## 2020-03-17 DIAGNOSIS — G4733 Obstructive sleep apnea (adult) (pediatric): Secondary | ICD-10-CM

## 2020-03-17 DIAGNOSIS — R634 Abnormal weight loss: Secondary | ICD-10-CM

## 2020-03-17 DIAGNOSIS — R112 Nausea with vomiting, unspecified: Secondary | ICD-10-CM

## 2020-03-17 DIAGNOSIS — K5909 Other constipation: Secondary | ICD-10-CM

## 2020-03-17 DIAGNOSIS — Z9989 Dependence on other enabling machines and devices: Secondary | ICD-10-CM

## 2020-03-17 LAB — CBC WITH DIFFERENTIAL/PLATELET
Basophils Absolute: 0 10*3/uL (ref 0.0–0.1)
Basophils Relative: 0.2 % (ref 0.0–3.0)
Eosinophils Absolute: 0.3 10*3/uL (ref 0.0–0.7)
Eosinophils Relative: 3.9 % (ref 0.0–5.0)
HCT: 46.2 % (ref 39.0–52.0)
Hemoglobin: 15.7 g/dL (ref 13.0–17.0)
Lymphocytes Relative: 18.4 % (ref 12.0–46.0)
Lymphs Abs: 1.3 10*3/uL (ref 0.7–4.0)
MCHC: 34 g/dL (ref 30.0–36.0)
MCV: 81.4 fl (ref 78.0–100.0)
Monocytes Absolute: 0.5 10*3/uL (ref 0.1–1.0)
Monocytes Relative: 7.5 % (ref 3.0–12.0)
Neutro Abs: 5 10*3/uL (ref 1.4–7.7)
Neutrophils Relative %: 70 % (ref 43.0–77.0)
Platelets: 167 10*3/uL (ref 150.0–400.0)
RBC: 5.68 Mil/uL (ref 4.22–5.81)
RDW: 15.4 % (ref 11.5–15.5)
WBC: 7.1 10*3/uL (ref 4.0–10.5)

## 2020-03-17 LAB — COMPREHENSIVE METABOLIC PANEL
ALT: 21 U/L (ref 0–53)
AST: 15 U/L (ref 0–37)
Albumin: 4.6 g/dL (ref 3.5–5.2)
Alkaline Phosphatase: 58 U/L (ref 39–117)
BUN: 11 mg/dL (ref 6–23)
CO2: 28 mEq/L (ref 19–32)
Calcium: 9.7 mg/dL (ref 8.4–10.5)
Chloride: 100 mEq/L (ref 96–112)
Creatinine, Ser: 0.73 mg/dL (ref 0.40–1.50)
GFR: 116.75 mL/min (ref >60.00)
Glucose, Bld: 97 mg/dL (ref 70–99)
Potassium: 4.1 mEq/L (ref 3.5–5.1)
Sodium: 136 mEq/L (ref 135–145)
Total Bilirubin: 0.7 mg/dL (ref 0.2–1.2)
Total Protein: 7.4 g/dL (ref 6.0–8.3)

## 2020-03-17 MED ORDER — PLENVU 140 G PO SOLR: 140.0000 g | ORAL | 0 refills | Status: DC

## 2020-03-17 NOTE — Patient Instructions (Signed)
If you are age 37 or older, your body mass index should be between 23-30. Your Body mass index is 42.72 kg/m. If this is out of the aforementioned range listed, please consider follow up with your Primary Care Provider.  If you are age 14 or younger, your body mass index should be between 19-25. Your Body mass index is 42.72 kg/m. If this is out of the aformentioned range listed, please consider follow up with your Primary Care Provider.   You have been scheduled for an endoscopy and colonoscopy. Please follow the written instructions given to you at your visit today. Please pick up your prep supplies at the pharmacy within the next 1-3 days. If you use inhalers (even only as needed), please bring them with you on the day of your procedure.  Your provider has requested that you go to the basement level for lab work before leaving today. Press "B" on the elevator. The lab is located at the first door on the left as you exit the elevator.  Due to recent changes in healthcare laws, you may see the results of your imaging and laboratory studies on MyChart before your provider has had a chance to review them.  We understand that in some cases there may be results that are confusing or concerning to you. Not all laboratory results come back in the same time frame and the provider may be waiting for multiple results in order to interpret others.  Please give [place] 48 hours in order for your provider to thoroughly review all the results before contacting the office for clarification of your results.     It was a pleasure to see you today!  Dr. Loletha Carrow

## 2020-03-17 NOTE — Progress Notes (Signed)
[facility] Consult Note:  History: Juan Grimes 03/17/2020  Referring provider: Donney Dice, DO  Reason for consult/chief complaint: Constipation (Patient reports generalized abdominal pain, he was on a regimen of Miralax, which he stopped. He presently has  movement once daily instead of multiple movements daily.)   Subjective  HPI:  This is a pleasant 35 year old man referred by primary care for abdominal pain and constipation.  He had a visit in October for several weeks of generalized abdominal pain, intermittent nausea and vomiting with chronic heartburn and constipation.  He was put on a PPI and MiraLAX, and says the upper digestive symptoms got much better but he is still struggling with constipation.  However, he admits to forgetting the MiraLAX most days.  Prior to several months ago, he would have 3 or 4 BMs during the workday without difficulty.  Now he typically has 1 with some difficulty and straining.  He still has postprandial generalized abdominal bloated discomfort, no longer has nausea and vomiting, denies early satiety or dysphagia.  His appetite is good, but he is documented down over 15 pounds since the October primary care visit, and he also believes he lost weight prior to that.  He had cut out soda from his daily routine, but did not expect to lose this much weight as a result.  ROS:  Review of Systems  Constitutional: Negative for appetite change and unexpected weight change.  HENT: Negative for mouth sores and voice change.   Eyes: Negative for pain and redness.  Respiratory: Negative for cough and shortness of breath.   Cardiovascular: Negative for chest pain and palpitations.  Genitourinary: Negative for dysuria and hematuria.  Musculoskeletal: Negative for arthralgias and myalgias.  Skin: Negative for pallor and rash.  Neurological: Negative for weakness and headaches.  Hematological: Negative for adenopathy.   Psychiatric/Behavioral:       Anxiety -reports that his mood has been stable lately     Past Medical History: Past Medical History:  Diagnosis Date  . Bipolar 1 disorder, manic, moderate ([place])   . HTN (hypertension)    normal renin 2008  . Obese   . Plantar fasciitis   . Seizure ([place])    reported massive seizure in 2008     Past Surgical History: Past Surgical History:  Procedure Laterality Date  . TONSILECTOMY, ADENOIDECTOMY, BILATERAL MYRINGOTOMY AND TUBES     1996     Family History: Family History  Problem Relation Age of Onset  . Bipolar disorder Father   . Hypertension Father   . Fibromyalgia Mother     Social History: Social History   Socioeconomic History  . Marital status: Single    Spouse name: Not on file  . Number of children: Not on file  . Years of education: Not on file  . Highest education level: Not on file  Occupational History  . Not on file  Tobacco Use  . Smoking status: Never Smoker  . Smokeless tobacco: Current User    Types: Snuff  . Tobacco comment: patches   Substance and Sexual Activity  . Alcohol use: Yes    Alcohol/week: 2.0 standard drinks    Types: 2 Standard drinks or equivalent per week  . Drug use: No  . Sexual activity: Not on file  Other Topics Concern  . Not on file  Social History Narrative   Lives with mother and father. Never married. 4 dogs and a fish.       Employed at [employer]  30 hrs/wk. Did not finish high school.      Hobbies: books, video games, backpacking, fishing      Sometimes has difficulty understanding medical information.   Social Determinants of Health   Financial Resource Strain: Not on file  Food Insecurity: Not on file  Transportation Needs: Not on file  Physical Activity: Not on file  Stress: Not on file  Social Connections: Not on file   He works in Therapist, occupational, Field seismologist   Allergies: No Known Allergies  Outpatient Meds: Current Outpatient Medications   Medication Sig Dispense Refill  . diazepam (VALIUM) 10 MG tablet Take 10 mg by mouth as needed for anxiety.     . diphenhydrAMINE (BENADRYL) 25 mg capsule Take 1 capsule (25 mg total) by mouth every 6 (six) hours as needed. 30 capsule 0  . Glucosamine HCl (GLUCOSAMINE PO) Take 1 tablet by mouth at bedtime.    . hydrochlorothiazide (HYDRODIURIL) 25 MG tablet Take 1 tablet (25 mg total) by mouth daily. 90 tablet 1  . lamoTRIgine (LAMICTAL) 200 MG tablet Take 400 mg by mouth at bedtime.     Marland Kitchen LATUDA 40 MG TABS tablet Take 40 mg by mouth daily.    . Multiple Vitamin (MULTIVITAMIN ADULT PO) Take 1 tablet by mouth at bedtime.    Marland Kitchen omeprazole (PRILOSEC) 40 MG capsule Take 1 capsule by mouth once daily 30 capsule 6  . PEG-KCl-NaCl-NaSulf-Na Asc-C (PLENVU) 140 g SOLR Take 140 g by mouth as directed. Manufacturer's coupon Universal coupon code:BIN: P2366821; GROUP: [id number]; PCN: [id number]; ID: [id number]; PAY NO MORE $50 1 each 0  . lisinopril (ZESTRIL) 30 MG tablet Take 1 tablet (30 mg total) by mouth daily. 30 tablet 6  . polyethylene glycol powder (GLYCOLAX/MIRALAX) 17 GM/SCOOP powder Take 17 g by mouth 2 (two) times daily as needed for moderate constipation. (Patient not taking: Reported on 03/17/2020) 3350 g 1   No current facility-administered medications for this visit.      ___________________________________________________________________ Objective   Exam:  BP 130/82   Pulse 82   Ht 6' (1.829 m)   Wt (!) 315 lb (142.9 kg)   BMI 42.72 kg/m  Wt Readings from Last 3 Encounters:  03/17/20 (!) 315 lb (142.9 kg)  01/23/20 (!) 330 lb (149.7 kg)  12/26/19 (!) 331 lb 3.2 oz (150.2 kg)     General: Not acutely ill-appearing.  Eyes: sclera anicteric, no redness  ENT: oral mucosa moist without lesions, no cervical or supraclavicular lymphadenopathy.  No axillary adenopathy  CV: RRR without murmur, S1/S2, no JVD, no peripheral edema  Resp: clear to auscultation bilaterally, normal RR and  effort noted  GI: soft, mild mid upper tenderness, with active bowel sounds. No guarding or palpable organomegaly noted.  Morbidly obese  Skin; warm and dry, no rash or jaundice noted  Neuro: awake, alert and oriented x 3. Normal gross motor function and fluent speech  Labs:  No blood test since April 2021.  He reports that primary care had ordered labs the day of his most recent visit, but he could not stay to do that since he had to get to work.  Radiologic Studies:  CLINICAL DATA:  Nausea and vomiting for 2 weeks.   EXAM: ULTRASOUND ABDOMEN LIMITED RIGHT UPPER QUADRANT   COMPARISON:  Abdominal ultrasound 05/12/2016   FINDINGS: Gallbladder:   No gallstones or wall thickening visualized. No sonographic Murphy sign noted by sonographer.   Common bile duct:   Diameter: 0.3 cm, within normal limits.  Liver:   Liver parenchymal echogenicity is diffusely increased. There is a hypoechoic area adjacent to the gallbladder fossa measuring 2.0 x 1.2 x 1.6 cm. Portal vein is patent on color Doppler imaging with normal direction of blood flow towards the liver.   Other: None.   IMPRESSION: Diffusely increased liver parenchymal echogenicity which is nonspecific but most commonly seen with hepatic steatosis. There is a hypoechoic area measuring 2.0 cm adjacent the gallbladder fossa which is technically indeterminate though favored to represent focal fatty sparing. Consider MRI of the liver for definitive evaluation.     Electronically Signed   By: Audie Pinto M.D.   On: 01/11/2020 17:56   Assessment: Encounter Diagnoses  Name Primary?  . Chronic constipation Yes  . Weight loss   . Nausea and vomiting in adult   . Obesity, morbid (BMI > 40)   . OSA on CPAP     Several months of multiple digestive symptoms with both upper GI things that have significantly improved but ongoing abdominal pain constipation as well as weight loss that is concerning.  Plan:  CBC  and CMP today  Upper endoscopy and colonoscopy.  He was agreeable after discussion of procedure and risks.  The benefits and risks of the planned procedure were described in detail with the patient or (when appropriate) their health care proxy.  Risks were outlined as including, but not limited to, bleeding, infection, perforation, adverse medication reaction leading to cardiac or pulmonary decompensation, pancreatitis (if ERCP).  The limitation of incomplete mucosal visualization was also discussed.  No guarantees or warranties were given.  I encouraged him to use the MiraLAX daily, both to help relieve his symptoms but also to ensure adequate bowel preparation for upcoming procedures.  Thank you for the courtesy of this consult.  Please call me with any questions or concerns.  Nelida Meuse III  CC: Referring provider noted above

## 2020-03-18 ENCOUNTER — Ambulatory Visit: Payer: Managed Care, Other (non HMO) | Admitting: Pulmonary Disease

## 2020-04-17 ENCOUNTER — Encounter: Payer: Self-pay | Admitting: Gastroenterology

## 2020-04-18 ENCOUNTER — Other Ambulatory Visit: Payer: Self-pay | Admitting: Family Medicine

## 2020-04-18 DIAGNOSIS — I10 Essential (primary) hypertension: Secondary | ICD-10-CM

## 2020-04-21 ENCOUNTER — Telehealth: Payer: Self-pay

## 2020-04-21 ENCOUNTER — Other Ambulatory Visit: Payer: Self-pay | Admitting: Gastroenterology

## 2020-04-21 NOTE — Telephone Encounter (Signed)
Pt is scheduled to see Dr. Loletha Carrow on 2/16 for a endocolon. Pt no showed covid test that was scheduled on 04/20/20. Spoke with pt, and he stated that no one informed him that he needed a covid test. Explained to pt that it was in his prep instructions letter, and per [place] since he was not vaccinated we needed a negative test before we could do his procedure. Pt stated he will try to make it today, 2/15 for covid test.

## 2020-04-21 NOTE — Telephone Encounter (Signed)
I spoke to this patient just now and he has already started his bowel preparation.  He had his Covid swab done about 3 PM today at the lab at [facility].  Hopefully we will have the result by midday tomorrow.  Lab needs to be contacted by midday tomorrow if we do not yet have the results so we can know whether or not this patient can arrive in the [facility] for his procedure as scheduled.  - H. Danis

## 2020-04-22 ENCOUNTER — Other Ambulatory Visit: Payer: Self-pay

## 2020-04-22 ENCOUNTER — Encounter: Payer: Self-pay | Admitting: Gastroenterology

## 2020-04-22 ENCOUNTER — Ambulatory Visit (AMBULATORY_SURGERY_CENTER): Payer: Managed Care, Other (non HMO) | Admitting: Gastroenterology

## 2020-04-22 VITALS — BP 116/72 | HR 76 | Temp 98.6°F | Resp 11 | Ht 72.0 in | Wt 315.0 lb

## 2020-04-22 DIAGNOSIS — R634 Abnormal weight loss: Secondary | ICD-10-CM

## 2020-04-22 DIAGNOSIS — K5909 Other constipation: Secondary | ICD-10-CM

## 2020-04-22 DIAGNOSIS — D128 Benign neoplasm of rectum: Secondary | ICD-10-CM | POA: Diagnosis not present

## 2020-04-22 DIAGNOSIS — R112 Nausea with vomiting, unspecified: Secondary | ICD-10-CM | POA: Diagnosis not present

## 2020-04-22 LAB — SARS CORONAVIRUS 2 (TAT 6-24 HRS): SARS Coronavirus 2: NEGATIVE

## 2020-04-22 MED ORDER — SODIUM CHLORIDE 0.9 % IV SOLN: 500.0000 mL | Freq: Once | INTRAVENOUS | Status: DC

## 2020-04-22 NOTE — Op Note (Signed)
[facility] Patient Name: Juan Grimes Procedure Date: 04/22/2020 2:46 PM MRN: [id number] Endoscopist: Mallie Mussel L. Loletha Carrow , MD Age: 35 Referring MD:  Date of Birth: [birth year] Gender: Male Account #: [id number] Procedure:                Colonoscopy Indications:              Constipation, Weight loss Medicines:                Monitored Anesthesia Care Procedure:                Pre-Anesthesia Assessment:                           - Prior to the procedure, a History and Physical                            was performed, and patient medications and                            allergies were reviewed. The patient's tolerance of                            previous anesthesia was also reviewed. The risks                            and benefits of the procedure and the sedation                            options and risks were discussed with the patient.                            All questions were answered, and informed consent                            was obtained. Prior Anticoagulants: The patient has                            taken no previous anticoagulant or antiplatelet                            agents. ASA Grade Assessment: III - A patient with                            severe systemic disease. After reviewing the risks                            and benefits, the patient was deemed in                            satisfactory condition to undergo the procedure.                           After obtaining informed consent, the colonoscope  was passed under direct vision. Throughout the                            procedure, the patient's blood pressure, pulse, and                            oxygen saturations were monitored continuously. The                            Olympus CF-HQ190L (Serial# [id number]) Colonoscope was                            introduced through the anus and advanced to the the                            terminal ileum, with  identification of the                            appendiceal orifice and IC valve. The colonoscopy                            was somewhat difficult due to poor bowel prep. The                            patient tolerated the procedure fairly well. The                            quality of the bowel preparation was poor. The                            terminal ileum, ileocecal valve, appendiceal                            orifice, and rectum were photographed. The bowel                            preparation used was Plenvu. Scope In: 2:56:24 PM Scope Out: 3:13:21 PM Scope Withdrawal Time: 0 hours 11 minutes 6 seconds  Total Procedure Duration: 0 hours 16 minutes 57 seconds  Findings:                 The perianal and digital rectal examinations were                            normal.                           The terminal ileum appeared normal.                           A large amount of semi-liquid stool was found in                            the entire colon, interfering with visualization.  Lavage of the area was performed, resulting in                            incomplete clearance with continued poor                            visualization.                           A diminutive polyp was found in the distal rectum.                            The polyp was sessile. The polyp was removed with a                            cold snare. Resection and retrieval were complete.                           The exam was otherwise without abnormality on                            direct and retroflexion views. Complications:            No immediate complications. Estimated Blood Loss:     Estimated blood loss was minimal. Impression:               - Preparation of the colon was poor.                           - The examined portion of the ileum was normal.                           - Stool in the entire examined colon.                           - One diminutive  polyp in the distal rectum,                            removed with a cold snare. Resected and retrieved.                           - The examination was otherwise normal on direct                            and retroflexion views.                           Constipation suspected to be side effect of one or                            more medicines. Recommendation:           - Patient has a contact number available for                            emergencies.  The signs and symptoms of potential                            delayed complications were discussed with the                            patient. Return to normal activities tomorrow.                            Written discharge instructions were provided to the                            patient.                           - Resume previous diet.                           - Continue present medications.                           - Await pathology results.                           - Repeat colonoscopy is recommended for                            surveillance. The colonoscopy date will be                            determined after pathology results from today's                            exam become available for review.                           - Miralax 1 capful (17 grams) in 8 ounces of water                            PO BID.                           - See the other procedure note for documentation of                            additional recommendations. Sophonie Goforth L. Loletha Carrow, MD 04/22/2020 3:44:48 PM This report has been signed electronically.

## 2020-04-22 NOTE — Progress Notes (Signed)
VS by CW  I have reviewed the patient's medical history in detail and updated the computerized patient record.  

## 2020-04-22 NOTE — Progress Notes (Signed)
Called to room to assist during endoscopic procedure.  Patient ID and intended procedure confirmed with present staff. Received instructions for my participation in the procedure from the performing physician.  

## 2020-04-22 NOTE — Patient Instructions (Addendum)
Thank you for allowing [place] to care for you today!  Await final result of polyp removed, approximately 7-10 days.  Will make recommendation for a future colonoscopy at that time.  Resume previous diet and medications today.  Resume normal daily activities tomorrow.  Begin Miralax 1 capful ( 17 grams) in 8 ounces of water twice per day.     YOU HAD AN ENDOSCOPIC PROCEDURE TODAY AT [facility]:   Refer to the procedure report that was given to you for any specific questions about what was found during the examination.  If the procedure report does not answer your questions, please call your gastroenterologist to clarify.  If you requested that your care partner not be given the details of your procedure findings, then the procedure report has been included in a sealed envelope for you to review at your convenience later.  YOU SHOULD EXPECT: Some feelings of bloating in the abdomen. Passage of more gas than usual.  Walking can help get rid of the air that was put into your GI tract during the procedure and reduce the bloating. If you had a lower endoscopy (such as a colonoscopy or flexible sigmoidoscopy) you may notice spotting of blood in your stool or on the toilet paper. If you underwent a bowel prep for your procedure, you may not have a normal bowel movement for a few days.  Please Note:  You might notice some irritation and congestion in your nose or some drainage.  This is from the oxygen used during your procedure.  There is no need for concern and it should clear up in a day or so.  SYMPTOMS TO REPORT IMMEDIATELY:   Following lower endoscopy (colonoscopy or flexible sigmoidoscopy):  Excessive amounts of blood in the stool  Significant tenderness or worsening of abdominal pains  Swelling of the abdomen that is new, acute  Fever of 100F or higher    For urgent or emergent issues, a gastroenterologist can be reached at any hour by calling [phone]. Do not use  MyChart messaging for urgent concerns.    DIET:  We do recommend a small meal at first, but then you may proceed to your regular diet.  Drink plenty of fluids but you should avoid alcoholic beverages for 24 hours.  ACTIVITY:  You should plan to take it easy for the rest of today and you should NOT DRIVE or use heavy machinery until tomorrow (because of the sedation medicines used during the test).    FOLLOW UP: Our staff will call the number listed on your records 48-72 hours following your procedure to check on you and address any questions or concerns that you may have regarding the information given to you following your procedure. If we do not reach you, we will leave a message.  We will attempt to reach you two times.  During this call, we will ask if you have developed any symptoms of COVID 19. If you develop any symptoms (ie: fever, flu-like symptoms, shortness of breath, cough etc.) before then, please call [phone].  If you test positive for Covid 19 in the 2 weeks post procedure, please call and report this information to [place].    If any biopsies were taken you will be contacted by phone or by letter within the next 1-3 weeks.  Please call us at [phone] if you have not heard about the biopsies in 3 weeks.    SIGNATURES/CONFIDENTIALITY: You and/or your care partner have signed paperwork which  will be entered into your electronic medical record.  These signatures attest to the fact that that the information above on your After Visit Summary has been reviewed and is understood.  Full responsibility of the confidentiality of this discharge information lies with you and/or your care-partner.

## 2020-04-22 NOTE — Progress Notes (Signed)
PT taken to PACU. Monitors in place. VSS. Report given to RN. 

## 2020-04-22 NOTE — Telephone Encounter (Signed)
Pt covid test came back negative.

## 2020-04-22 NOTE — Op Note (Signed)
[facility] Patient Name: Juan Grimes Procedure Date: 04/22/2020 2:46 PM MRN: [id number] Endoscopist: Mallie Mussel L. Loletha Carrow , MD Age: 35 Referring MD:  Date of Birth: [birth year] Gender: Male Account #: [id number] Procedure:                Upper GI endoscopy Indications:              Nausea, Weight loss Medicines:                Monitored Anesthesia Care Procedure:                Pre-Anesthesia Assessment:                           - Prior to the procedure, a History and Physical                            was performed, and patient medications and                            allergies were reviewed. The patient's tolerance of                            previous anesthesia was also reviewed. The risks                            and benefits of the procedure and the sedation                            options and risks were discussed with the patient.                            All questions were answered, and informed consent                            was obtained. Prior Anticoagulants: The patient has                            taken no previous anticoagulant or antiplatelet                            agents. ASA Grade Assessment: III - A patient with                            severe systemic disease. After reviewing the risks                            and benefits, the patient was deemed in                            satisfactory condition to undergo the procedure.                           After obtaining informed consent, the endoscope was  passed under direct vision. Throughout the                            procedure, the patient's blood pressure, pulse, and                            oxygen saturations were monitored continuously. The                            Endoscope was introduced through the mouth, and                            advanced to the second part of duodenum. The upper                            GI endoscopy was accomplished  without difficulty.                            The patient tolerated the procedure. Scope In: Scope Out: Findings:                 The esophagus was normal.                           The stomach was normal.                           The cardia and gastric fundus were normal on                            retroflexion.                           The examined duodenum was normal. Complications:            No immediate complications. Estimated Blood Loss:     Estimated blood loss: none. Impression:               - Normal esophagus.                           - Normal stomach.                           - Normal examined duodenum.                           - No specimens collected.                           No cause for weight loss on either procedure today. Recommendation:           - Patient has a contact number available for                            emergencies. The signs and symptoms of potential  delayed complications were discussed with the                            patient. Return to normal activities tomorrow.                            Written discharge instructions were provided to the                            patient.                           - Resume previous diet.                           - Continue present medications.                           - See the other procedure note for documentation of                            additional recommendations.                           - Return to my office at appointment to be                            scheduled. Seleta Hovland L. Loletha Carrow, MD 04/22/2020 3:47:29 PM This report has been signed electronically.

## 2020-04-23 ENCOUNTER — Telehealth: Payer: Self-pay

## 2020-04-23 NOTE — Telephone Encounter (Addendum)
Per 04/22/20 office note  - return to office at appointment to be scheduled.   Spoke with patient, he requested a Friday appointment which is not available until 06/05/20. He states that he is not currently having any issues and is okay with this appointment but will call if anything changes. Patient has been scheduled for a follow up on Friday, 06/05/20 at 3:40 PM. Patient verbalized understanding and had no concerns at the end of the call.  Letter mailed with appointment information.

## 2020-04-24 ENCOUNTER — Telehealth: Payer: Self-pay | Admitting: *Deleted

## 2020-04-24 NOTE — Telephone Encounter (Signed)
  Follow up Call-  Call back number 04/22/2020  Post procedure Call Back phone  # [phone]  Permission to leave phone message Yes  Some recent data might be hidden     Patient questions:  Do you have a fever, pain , or abdominal swelling? No. Pain Score  0 *  Have you tolerated food without any problems? Yes.    Have you been able to return to your normal activities? Yes.    Do you have any questions about your discharge instructions: Diet   No. Medications  No. Follow up visit  No.  Do you have questions or concerns about your Care? No.  Actions: * If pain score is 4 or above: 1. No action needed, pain <4.Have you developed a fever since your procedure? no  2.   Have you had an respiratory symptoms (SOB or cough) since your procedure? no  3.   Have you tested positive for COVID 19 since your procedure no  4.   Have you had any family members/close contacts diagnosed with the COVID 19 since your procedure?  no   If yes to any of these questions please route to Joylene John, RN and Joella Prince, RN

## 2020-04-30 ENCOUNTER — Encounter: Payer: Self-pay | Admitting: Gastroenterology

## 2020-06-05 ENCOUNTER — Other Ambulatory Visit: Payer: Self-pay

## 2020-06-05 ENCOUNTER — Encounter: Payer: Self-pay | Admitting: Gastroenterology

## 2020-06-05 ENCOUNTER — Ambulatory Visit (INDEPENDENT_AMBULATORY_CARE_PROVIDER_SITE_OTHER): Payer: Managed Care, Other (non HMO) | Admitting: Gastroenterology

## 2020-06-05 VITALS — BP 104/72 | HR 82 | Ht 72.0 in | Wt 303.0 lb

## 2020-06-05 DIAGNOSIS — K5909 Other constipation: Secondary | ICD-10-CM

## 2020-06-05 DIAGNOSIS — R140 Abdominal distension (gaseous): Secondary | ICD-10-CM | POA: Diagnosis not present

## 2020-06-05 DIAGNOSIS — R112 Nausea with vomiting, unspecified: Secondary | ICD-10-CM

## 2020-06-05 DIAGNOSIS — R634 Abnormal weight loss: Secondary | ICD-10-CM | POA: Diagnosis not present

## 2020-06-05 NOTE — Progress Notes (Signed)
[facility] Progress Note  Chief Complaint: Abdominal pain and constipation  Subjective  History: From my January 11 office note: "Several months of multiple digestive symptoms with both upper GI things that have significantly improved but ongoing abdominal pain constipation as well as weight loss that is concerning.   Plan:   CBC and CMP today   Upper endoscopy and colonoscopy.  He was agreeable after discussion of procedure and risks.             The benefits and risks of the planned procedure were described in detail with the patient or (when appropriate) their health care proxy.  Risks were outlined as including, but not limited to, bleeding, infection, perforation, adverse medication reaction leading to cardiac or pulmonary decompensation, pancreatitis (if ERCP).  The limitation of incomplete mucosal visualization was also discussed.  No guarantees or warranties were given.   I encouraged him to use the MiraLAX daily, both to help relieve his symptoms but also to ensure adequate bowel preparation for upcoming procedures." ___________________________  EGD and colonoscopy on February 16 normal except for poor colonic bowel preparation and a diminutive rectal adenomatous polyp with a recall in 3 years recommended.  He was advised to take MiraLAX twice daily and follow-up in the clinic   Ahmed tells me that he is improved overall.  With a capful of MiraLAX and a large glass of water daily he is having 1-2 formed BMs per day.  His bloating is decreased, and he has also continued on a journey to better health with lower sugar and complete lamination of soda.  His weight is down 10 or 12 pounds as a result since his last visit here. I asked when he started the lurasidone, and he believes it was about 6 months ago, which approximately coincides with the onset of his digestive symptoms.  He tells me that the medicine has been very helpful for mood stabilization. He no longer has  nausea and vomiting.  He has episodic bloating that he attributes to some dietary triggers. [place] has been working very hard with extra hours in his job at a [employer].  ROS: Cardiovascular:  no chest pain Respiratory: no dyspnea  The patient's Past Medical, Family and Social History were reviewed and are on file in the EMR.  Objective:  Med list reviewed  Current Outpatient Medications:  .  diazepam (VALIUM) 10 MG tablet, Take 10 mg by mouth as needed for anxiety. , Disp: , Rfl:  .  diphenhydrAMINE (BENADRYL) 25 mg capsule, Take 1 capsule (25 mg total) by mouth every 6 (six) hours as needed., Disp: 30 capsule, Rfl: 0 .  Glucosamine HCl (GLUCOSAMINE PO), Take 1 tablet by mouth at bedtime., Disp: , Rfl:  .  hydrochlorothiazide (HYDRODIURIL) 25 MG tablet, Take 1 tablet by mouth once daily, Disp: 90 tablet, Rfl: 0 .  lamoTRIgine (LAMICTAL) 200 MG tablet, Take 400 mg by mouth at bedtime. , Disp: , Rfl:  .  LATUDA 40 MG TABS tablet, Take 40 mg by mouth daily., Disp: , Rfl:  .  Multiple Vitamin (MULTIVITAMIN ADULT PO), Take 1 tablet by mouth at bedtime., Disp: , Rfl:  .  omeprazole (PRILOSEC) 40 MG capsule, Take 1 capsule by mouth once daily, Disp: 30 capsule, Rfl: 6 .  polyethylene glycol powder (GLYCOLAX/MIRALAX) 17 GM/SCOOP powder, Take 17 g by mouth 2 (two) times daily as needed for moderate constipation., Disp: 3350 g, Rfl: 1 .  lisinopril (ZESTRIL) 30 MG tablet,  Take 1 tablet (30 mg total) by mouth daily., Disp: 30 tablet, Rfl: 6   Vital signs in last 24 hrs: Vitals:   06/05/20 1547  BP: 104/72  Pulse: 82   Wt Readings from Last 3 Encounters:  06/05/20 (!) 303 lb (137.4 kg)  04/22/20 (!) 315 lb (142.9 kg)  03/17/20 (!) 315 lb (142.9 kg)    Physical Exam   HEENT: sclera anicteric, oral mucosa moist without lesions  Neck: supple, no thyromegaly, JVD or lymphadenopathy  Cardiac: RRR without murmurs, S1S2 heard, no peripheral edema  Pulm: clear to auscultation  bilaterally, normal RR and effort noted  Abdomen: soft, obese, no tenderness, with active bowel sounds. No guarding or palpable hepatosplenomegaly, limited by body habitus  Skin; warm and dry, no jaundice or rash  Labs:  CBC Latest Ref Rng & Units 03/17/2020 07/04/2019 05/12/2016  WBC 4.0 - 10.5 K/uL 7.1 7.2 11.5(H)  Hemoglobin 13.0 - 17.0 g/dL 15.7 15.2 15.4  Hematocrit 39.0 - 52.0 % 46.2 47.2 45.7  Platelets 150.0 - 400.0 K/uL 167.0 216 217   CMP Latest Ref Rng & Units 03/17/2020 07/04/2019 06/06/2017  Glucose 70 - 99 mg/dL 97 113(H) 97  BUN 6 - 23 mg/dL 11 14 17   Creatinine 0.40 - 1.50 mg/dL 0.73 0.92 0.72(L)  Sodium 135 - 145 mEq/L 136 139 141  Potassium 3.5 - 5.1 mEq/L 4.1 3.9 4.2  Chloride 96 - 112 mEq/L 100 101 102  CO2 19 - 32 mEq/L 28 25 21   Calcium 8.4 - 10.5 mg/dL 9.7 9.8 8.9  Total Protein 6.0 - 8.3 g/dL 7.4 7.1 -  Total Bilirubin 0.2 - 1.2 mg/dL 0.7 0.9 -  Alkaline Phos 39 - 117 U/L 58 59 -  AST 0 - 37 U/L 15 35 -  ALT 0 - 53 U/L 21 44 -    ___________________________________________ Radiologic studies:   ____________________________________________ Other:  Polyp pathology reviewed and discussed with patient.  He received a pathology letter about it as well. _____________________________________________ Assessment & Plan  Assessment: Encounter Diagnoses  Name Primary?  . Chronic constipation Yes  . Nausea and vomiting in adult   . Weight loss   . Abdominal bloating    His digestive symptoms are much improved.  Endoscopic work-up unrevealing.  Based on history, I now suspect this could be at least partially side effects of his lurasidone.  However, that medicine has been very helpful for him. I recommend continuing the MiraLAX as he is currently doing, as prescription medicine such as Linzess or Amitiza would likely give him loose stool, which would be difficult given his current job.  And prucalopride has potential drug interactions (though no reported high risk  interactions with lurasidone from a check of the drug database today).  No further testing at this point, and I will be glad to see him as needed, especially if he has worsening of symptoms.  22 minutes were spent on this encounter (including chart review, history/exam, counseling/coordination of care, and documentation) > 50% of that time was spent on counseling and coordination of care.  Topics discussed included: Chronic constipation, pathology review.  Nelida Meuse III

## 2020-06-05 NOTE — Patient Instructions (Signed)
If you are age 37 or older, your body mass index should be between 23-30. Your Body mass index is 41.09 kg/m. If this is out of the aforementioned range listed, please consider follow up with your Primary Care Provider.  If you are age 66 or younger, your body mass index should be between 19-25. Your Body mass index is 41.09 kg/m. If this is out of the aformentioned range listed, please consider follow up with your Primary Care Provider.   It was a pleasure to see you today!  Dr. Loletha Carrow

## 2020-06-12 ENCOUNTER — Ambulatory Visit: Payer: Managed Care, Other (non HMO) | Admitting: Cardiology

## 2020-06-30 ENCOUNTER — Other Ambulatory Visit: Payer: Self-pay | Admitting: Family Medicine

## 2020-06-30 DIAGNOSIS — I10 Essential (primary) hypertension: Secondary | ICD-10-CM

## 2020-07-05 NOTE — Progress Notes (Signed)
Cardiology Office Note   Date:  07/06/2020   ID:  Juan Grimes, DOB 10-10-1985, MRN [id number]  PCP:  Juan Dice, DO  Cardiologist:   No primary care provider on file. Referring:  Juan Dice, DO  Chief Complaint  Patient presents with  . Follow-up    6 months.  . Pericardial Effusion      History of Present Illness: Juan Grimes is a 35 y.o. male who was referred by Juan Dice, DO for evaluation of DOE.    At the time of our initial evaluation he had a small pericardial effusion.   I followed this 5 months later with another echo which was unchanged from previous.     Since I last saw him he has lost almost 70 pounds by cutting down or out sugar sodas.  He feels better.  The patient denies any new symptoms such as chest discomfort, neck or arm discomfort. There has been no new shortness of breath, PND or orthopnea. There have been no reported palpitations, presyncope or syncope.  He has a very active physical job.   Past Medical History:  Diagnosis Date  . Bipolar 1 disorder, manic, moderate ([place])   . HTN (hypertension)    normal renin 2008  . Obese   . Plantar fasciitis   . Seizure [facility])    reported massive seizure in 2008    Past Surgical History:  Procedure Laterality Date  . TONSILECTOMY, ADENOIDECTOMY, BILATERAL MYRINGOTOMY AND TUBES     1996     Current Outpatient Medications  Medication Sig Dispense Refill  . diazepam (VALIUM) 10 MG tablet Take 10 mg by mouth as needed for anxiety.     . diphenhydrAMINE (BENADRYL) 25 mg capsule Take 1 capsule (25 mg total) by mouth every 6 (six) hours as needed. 30 capsule 0  . Glucosamine HCl (GLUCOSAMINE PO) Take 1 tablet by mouth at bedtime.    . hydrochlorothiazide (HYDRODIURIL) 25 MG tablet Take 1 tablet by mouth once daily 90 tablet 0  . lamoTRIgine (LAMICTAL) 200 MG tablet Take 400 mg by mouth at bedtime.     Marland Kitchen LATUDA 40 MG TABS tablet Take 40 mg by mouth daily.    Marland Kitchen lisinopril (ZESTRIL) 30 MG tablet  Take 1 tablet by mouth once daily 30 tablet 0  . Multiple Vitamin (MULTIVITAMIN ADULT PO) Take 1 tablet by mouth at bedtime.    Marland Kitchen omeprazole (PRILOSEC) 40 MG capsule Take 1 capsule by mouth once daily 30 capsule 6  . polyethylene glycol powder (GLYCOLAX/MIRALAX) 17 GM/SCOOP powder Take 17 g by mouth 2 (two) times daily as needed for moderate constipation. 3350 g 1   No current facility-administered medications for this visit.    Allergies:   Patient has no known allergies.  ROS:  Please see the history of present illness.   Otherwise, review of systems are positive for none.   All other systems are reviewed and negative.    PHYSICAL EXAM: VS:  BP 124/70 (BP Location: Left Arm, Patient Position: Sitting, Cuff Size: Large)   Pulse 83   Ht 6\' 1"  (1.854 m)   Wt (!) 304 lb (137.9 kg)   BMI 40.11 kg/m  , BMI Body mass index is 40.11 kg/m. GENERAL:  Well appearing NECK:  No jugular venous distention, waveform within normal limits, carotid upstroke brisk and symmetric, no bruits, no thyromegaly LUNGS:  Clear to auscultation bilaterally CHEST:  Unremarkable HEART:  PMI not displaced or sustained,S1 and  S2 within normal limits, no S3, no S4, no clicks, no rubs, no murmurs ABD:  Flat, positive bowel sounds normal in frequency in pitch, no bruits, no rebound, no guarding, no midline pulsatile mass, no hepatomegaly, no splenomegaly EXT:  2 plus pulses throughout, no edema, no cyanosis no clubbing  EKG:  EKG is not ordered today. The ekg ordered today demonstrates sinus rhythm, rate 83, axis within normal limits, intervals within normal limits, no acute ST-T wave changes.   Recent Labs: 03/17/2020: ALT 21; BUN 11; Creatinine, Ser 0.73; Hemoglobin 15.7; Platelets 167.0; Potassium 4.1; Sodium 136    Lipid Panel    Component Value Date/Time   CHOL 122 01/19/2016 1645   TRIG 73 01/19/2016 1645   HDL 30 (L) 01/19/2016 1645   CHOLHDL 4.1 01/19/2016 1645   VLDL 15 01/19/2016 1645   LDLCALC 77  01/19/2016 1645   LDLDIRECT 121 (H) 03/24/2011 1636      Wt Readings from Last 3 Encounters:  07/06/20 (!) 304 lb (137.9 kg)  06/05/20 (!) 303 lb (137.4 kg)  04/22/20 (!) 315 lb (142.9 kg)      Other studies Reviewed: Additional studies/ records that were reviewed today include: Echo images reviewed with the patient in the room.  Review of the above records demonstrates:  Please see elsewhere in the note.     ASSESSMENT AND PLAN:  CHEST PAIN:      The patient has had no further chest pain.  No further work-up is suggested.  MORBID OBESITY:   I am very proud of his weight loss and we talked about more strategies to add to this excess.   PERICARDIAL EFFUSION:   I reviewed this and this was a small effusion.  It has not changed over serial examinations.  He has absolutely no symptoms or physical findings that would suggest worsening of this.  I do not think further evaluation is warranted.  He will let me know if he develops such issues as chest pain or shortness of breath.   SLEEP APNEA:   He has a new mask on order.   The following changes have been made: None  Labs/ tests ordered today include: None  Orders Placed This Encounter  Procedures  . EKG 12-Lead     Disposition:   FU with me as needed   Signed, Minus Breeding, MD  07/06/2020 9:19 AM    [place]

## 2020-07-06 ENCOUNTER — Encounter: Payer: Self-pay | Admitting: Cardiology

## 2020-07-06 ENCOUNTER — Ambulatory Visit (INDEPENDENT_AMBULATORY_CARE_PROVIDER_SITE_OTHER): Payer: Managed Care, Other (non HMO) | Admitting: Cardiology

## 2020-07-06 ENCOUNTER — Other Ambulatory Visit: Payer: Self-pay

## 2020-07-06 VITALS — BP 124/70 | HR 83 | Ht 73.0 in | Wt 304.0 lb

## 2020-07-06 DIAGNOSIS — R072 Precordial pain: Secondary | ICD-10-CM | POA: Diagnosis not present

## 2020-07-06 DIAGNOSIS — I313 Pericardial effusion (noninflammatory): Secondary | ICD-10-CM

## 2020-07-06 DIAGNOSIS — I3139 Other pericardial effusion (noninflammatory): Secondary | ICD-10-CM

## 2020-07-06 NOTE — Patient Instructions (Signed)
Medication Instructions:  Your physician recommends that you continue on your current medications as directed. Please refer to the Current Medication list given to you today.  *If you need a refill on your cardiac medications before your next appointment, please call your pharmacy*   Lab Work: None ordered.    Testing/Procedures: None ordered.    Follow-Up: At [facility], you and your health needs are our priority.  As part of our continuing mission to provide you with exceptional heart care, we have created designated Provider Care Teams.  These Care Teams include your primary Cardiologist (physician) and Advanced Practice Providers (APPs -  Physician Assistants and Nurse Practitioners) who all work together to provide you with the care you need, when you need it.  We recommend signing up for the patient portal called "MyChart".  Sign up information is provided on this After Visit Summary.  MyChart is used to connect with patients for Virtual Visits (Telemedicine).  Patients are able to view lab/test results, encounter notes, upcoming appointments, etc.  Non-urgent messages can be sent to your provider as well.   To learn more about what you can do with MyChart, go to [email or web address].    Your next appointment:   As needed  The format for your next appointment:   In Person  Provider:   James Hochrein, MD   

## 2020-07-24 ENCOUNTER — Other Ambulatory Visit: Payer: Self-pay | Admitting: Family Medicine

## 2020-07-24 DIAGNOSIS — I10 Essential (primary) hypertension: Secondary | ICD-10-CM

## 2020-08-11 ENCOUNTER — Other Ambulatory Visit: Payer: Self-pay | Admitting: Family Medicine

## 2020-08-11 DIAGNOSIS — I10 Essential (primary) hypertension: Secondary | ICD-10-CM

## 2020-08-13 ENCOUNTER — Emergency Department (HOSPITAL_COMMUNITY): Payer: No Typology Code available for payment source

## 2020-08-13 ENCOUNTER — Other Ambulatory Visit: Payer: Self-pay

## 2020-08-13 ENCOUNTER — Encounter (HOSPITAL_COMMUNITY): Payer: Self-pay

## 2020-08-13 ENCOUNTER — Emergency Department (HOSPITAL_COMMUNITY)
Admission: EM | Admit: 2020-08-13 | Discharge: 2020-08-13 | Disposition: A | Discharge status: Home / Self Care | Payer: No Typology Code available for payment source | Attending: Emergency Medicine | Admitting: Emergency Medicine

## 2020-08-13 DIAGNOSIS — W231XXA Caught, crushed, jammed, or pinched between stationary objects, initial encounter: Secondary | ICD-10-CM | POA: Insufficient documentation

## 2020-08-13 DIAGNOSIS — Z23 Encounter for immunization: Secondary | ICD-10-CM | POA: Insufficient documentation

## 2020-08-13 DIAGNOSIS — Y990 Civilian activity done for income or pay: Secondary | ICD-10-CM | POA: Diagnosis not present

## 2020-08-13 DIAGNOSIS — S62524B Nondisplaced fracture of distal phalanx of right thumb, initial encounter for open fracture: Secondary | ICD-10-CM | POA: Diagnosis not present

## 2020-08-13 DIAGNOSIS — F17290 Nicotine dependence, other tobacco product, uncomplicated: Secondary | ICD-10-CM | POA: Insufficient documentation

## 2020-08-13 DIAGNOSIS — Z79899 Other long term (current) drug therapy: Secondary | ICD-10-CM | POA: Insufficient documentation

## 2020-08-13 DIAGNOSIS — I10 Essential (primary) hypertension: Secondary | ICD-10-CM | POA: Diagnosis not present

## 2020-08-13 DIAGNOSIS — S62521B Displaced fracture of distal phalanx of right thumb, initial encounter for open fracture: Secondary | ICD-10-CM

## 2020-08-13 DIAGNOSIS — S6991XA Unspecified injury of right wrist, hand and finger(s), initial encounter: Secondary | ICD-10-CM | POA: Diagnosis present

## 2020-08-13 MED ORDER — CEPHALEXIN 500 MG PO CAPS
500.0000 mg | ORAL_CAPSULE | Freq: Once | ORAL | Status: AC
  Administered 2020-08-13: 500 mg via ORAL
  Filled 2020-08-13: qty 1

## 2020-08-13 MED ORDER — TETANUS-DIPHTH-ACELL PERTUSSIS 5-2.5-18.5 LF-MCG/0.5 IM SUSY
0.5000 mL | PREFILLED_SYRINGE | Freq: Once | INTRAMUSCULAR | Status: AC
  Administered 2020-08-13: 0.5 mL via INTRAMUSCULAR
  Filled 2020-08-13: qty 0.5

## 2020-08-13 MED ORDER — TETANUS-DIPHTH-ACELL PERTUSSIS 5-2.5-18.5 LF-MCG/0.5 IM SUSY
0.5000 mL | PREFILLED_SYRINGE | Freq: Once | INTRAMUSCULAR | Status: DC
  Filled 2020-08-13: qty 0.5

## 2020-08-13 MED ORDER — HYDROCODONE-ACETAMINOPHEN 5-325 MG PO TABS: 1.0000 | ORAL_TABLET | Freq: Four times a day (QID) | ORAL | 0 refills | Status: DC | PRN

## 2020-08-13 MED ORDER — LIDOCAINE HCL 2 % IJ SOLN
10.0000 mL | Freq: Once | INTRAMUSCULAR | Status: DC
  Filled 2020-08-13: qty 20

## 2020-08-13 MED ORDER — IBUPROFEN 800 MG PO TABS
800.0000 mg | ORAL_TABLET | Freq: Once | ORAL | Status: AC
  Administered 2020-08-13: 800 mg via ORAL
  Filled 2020-08-13: qty 1

## 2020-08-13 MED ORDER — CEPHALEXIN 500 MG PO CAPS: 500.0000 mg | ORAL_CAPSULE | Freq: Four times a day (QID) | ORAL | 0 refills | Status: AC

## 2020-08-13 NOTE — Discharge Instructions (Addendum)
You were given a prescription for antibiotics. Please take the antibiotic prescription fully.   Prescription given for Norco. Take medication as directed and do not operate machinery, drive a car, or work while taking this medication as it can make you drowsy.   Please follow up with the hand doctor in the next 5-7 days  Please return to the emergency department for any new or worsening symptoms.

## 2020-08-13 NOTE — ED Triage Notes (Signed)
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

## 2020-08-13 NOTE — ED Provider Notes (Signed)
[facility] DEPT Provider Note   CSN: [id number] Arrival date & time: 08/13/20  1826     History Chief Complaint  Patient presents with   thumb injury    Juan Grimes is a 35 y.o. male.  HPI  35 year old male with a history of bipolar disorder, hypertension, obesity, Planter fasciitis, seizures, who presents to the emergency department today for evaluation of pain to the right thumb.  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  He does not remember when his last Tdap was.  He denies any other injuries.  Past Medical History:  Diagnosis Date   Bipolar 1 disorder, manic, moderate (HCC)    HTN (hypertension)    normal renin 2008   Obese    Plantar fasciitis    Seizure ([place])    reported massive seizure in 2008    Patient Active Problem List   Diagnosis Date Noted   Constipation 12/26/2019   Chest pain 08/30/2019   Educated about COVID-19 virus infection 07/31/2019   Essential hypertension 07/24/2019   Dyspnea on exertion 07/10/2019   Gastroesophageal reflux disease 07/10/2019   Rash and nonspecific skin eruption 07/10/2014   OSA on CPAP 11/25/2010   History of Seizure 11/25/2010   HTN (hypertension) 11/15/2010   Obesity, morbid (BMI > 40) 11/15/2010   Bipolar I disorder ([place]) 11/15/2010    Past Surgical History:  Procedure Laterality Date   TONSILECTOMY, ADENOIDECTOMY, BILATERAL MYRINGOTOMY AND TUBES     1996       Family History  Problem Relation Age of Onset   Bipolar disorder Father    Hypertension Father    Fibromyalgia Mother     Social History   Tobacco Use   Smoking status: Never   Smokeless tobacco: Current    Types: Snuff   Tobacco comments:    using nitcotine pouches  Vaping Use   Vaping Use: Never used  Substance Use Topics   Alcohol use: Yes    Alcohol/week: 2.0 standard drinks    Types: 2 Standard drinks or equivalent per week   Drug use: No    Home  Medications Prior to Admission medications   Medication Sig Start Date End Date Taking? Authorizing Provider  cephALEXin (KEFLEX) 500 MG capsule Take 1 capsule (500 mg total) by mouth 4 (four) times daily for 7 days. 08/13/20 08/20/20 Yes Joana Nolton S, PA-C  HYDROcodone-acetaminophen (NORCO/VICODIN) 5-325 MG tablet Take 1 tablet by mouth every 6 (six) hours as needed. 08/13/20  Yes Roxana Lai S, PA-C  diazepam (VALIUM) 10 MG tablet Take 10 mg by mouth as needed for anxiety.  02/07/19   [provider]  diphenhydrAMINE (BENADRYL) 25 mg capsule Take 1 capsule (25 mg total) by mouth every 6 (six) hours as needed. 05/18/14   Britt Bottom, NP  Glucosamine HCl (GLUCOSAMINE PO) Take 1 tablet by mouth at bedtime.    [provider]  hydrochlorothiazide (HYDRODIURIL) 25 MG tablet Take 1 tablet by mouth once daily 07/24/20   Ganta, Anupa, DO  lamoTRIgine (LAMICTAL) 200 MG tablet Take 400 mg by mouth at bedtime.  04/06/16   [provider]  LATUDA 40 MG TABS tablet Take 40 mg by mouth daily. 11/18/19   [provider]  lisinopril (ZESTRIL) 30 MG tablet Take 1 tablet by mouth once daily 08/13/20   Ganta, Anupa, DO  Multiple Vitamin (MULTIVITAMIN ADULT PO) Take 1 tablet by mouth at bedtime.    [provider]  omeprazole (PRILOSEC) 40 MG capsule Take 1 capsule by mouth once daily 02/19/20   Ganta, Anupa, DO  polyethylene glycol powder (GLYCOLAX/MIRALAX) 17 GM/SCOOP powder Take 17 g by mouth 2 (two) times daily as needed for moderate constipation. 12/26/19   Donney Dice, DO    Allergies    Patient has no known allergies.  Review of Systems   Review of Systems  Constitutional:  Negative for fever.  Musculoskeletal:        Right thumb pain  Skin:  Positive for wound.   Physical Exam Updated Vital Signs BP (!) 154/93 (BP Location: Left Arm)   Pulse 86   Temp 98.1 F (36.7 C) (Oral)   Resp 16   Ht 6\' 1"  (1.854 m)   Wt (!) 137.4 kg   SpO2 100%    BMI 39.98 kg/m   Physical Exam Constitutional:      General: He is not in acute distress.    Appearance: He is well-developed.  Eyes:     Conjunctiva/sclera: Conjunctivae normal.  Cardiovascular:     Rate and Rhythm: Normal rate.  Pulmonary:     Effort: Pulmonary effort is normal.  Skin:    General: Skin is warm and dry.  Neurological:     Mental Status: He is alert and oriented to person, place, and time.    ED Results / Procedures / Treatments   Labs (all labs ordered are listed, but only abnormal results are displayed) Labs Reviewed - No data to display  EKG None  Radiology DG Finger Thumb Right  Result Date: 08/13/2020 CLINICAL DATA:  Trauma to the right film. EXAM: RIGHT THUMB 2+V COMPARISON:  None. FINDINGS: There is a small bony fragment with fracture line distal to the distal aspect of the right first distal phalanx. There is no dislocation. IMPRESSION: There is a small bony fragment with fracture line distal to the distal aspect of the right first distal phalanx. Electronically Signed   By: Abelardo Diesel M.D.   On: 08/13/2020 19:26    Procedures Procedures   Medications Ordered in ED Medications  cephALEXin (KEFLEX) capsule 500 mg (500 mg Oral Given 08/13/20 2145)  ibuprofen (ADVIL) tablet 800 mg (800 mg Oral Given 08/13/20 2146)    ED Course  I have reviewed the triage vital signs and the nursing notes.  Pertinent labs & imaging results that were available during my care of the patient were reviewed by me and considered in my medical decision making (see chart for details).    MDM Rules/Calculators/A&P                          35 year old male presenting for evaluation of crush injury to the right thumb.  He has wound noted to the radial aspect of the right thumb adjacent to the nailbed.  The nailbed appears to be intact.  He has sensation and brisk cap refill distally.  There is no repairable wound present.  Wound was irrigated copiously and dressing placed.   Tdap was updated.  Patient was given a splint.  He will be placed on Keflex as x-ray shows a fracture to the right first distal phalanx.  We will have him follow-up with hand and return to the ED for new or worsening symptoms.  He voiced understanding the plan and reasons to return. all Questions answered.  Patient stable for discharge.  Final Clinical Impression(s) / ED Diagnoses Final diagnoses:  Open displaced fracture of distal phalanx  of right thumb, initial encounter    Rx / DC Orders ED Discharge Orders          Ordered    cephALEXin (KEFLEX) 500 MG capsule  4 times daily        08/13/20 2149    HYDROcodone-acetaminophen (NORCO/VICODIN) 5-325 MG tablet  Every 6 hours PRN        08/13/20 2149             Rodney Booze, PA-C 08/13/20 2150    Charlesetta Shanks, MD 08/18/20 2104

## 2020-08-13 NOTE — ED Notes (Signed)
Finger splint applied.

## 2020-08-13 NOTE — ED Provider Notes (Signed)
Emergency Medicine Provider Triage Evaluation Note  Juan Grimes , a 35 y.o. male  was evaluated in triage.  Pt complains of right thumb pain that occurred pta.  Review of Systems  Positive: Thumb pain Negative: fever  Physical Exam  BP (!) 154/93 (BP Location: Left Arm)   Pulse 86   Temp 98.1 F (36.7 C) (Oral)   Resp 16   Ht 6\' 1"  (1.854 m)   Wt (!) 137.4 kg   SpO2 100%   BMI 39.98 kg/m  Gen:   Awake, no distress   Resp:  Normal effort  MSK:   Moves extremities without difficulty  Other:  Wound to right thumb  Medical Decision Making  Medically screening exam initiated at 6:50 PM.  Appropriate orders placed.  ELEANOR DIMICHELE was informed that the remainder of the evaluation will be completed by another provider, this initial triage assessment does not replace that evaluation, and the importance of remaining in the ED until their evaluation is complete.     Bishop Dublin 08/13/20 1850    Charlesetta Shanks, MD 08/18/20 2104

## 2020-10-14 ENCOUNTER — Other Ambulatory Visit: Payer: Self-pay | Admitting: Family Medicine

## 2020-10-14 DIAGNOSIS — I10 Essential (primary) hypertension: Secondary | ICD-10-CM

## 2020-10-28 ENCOUNTER — Other Ambulatory Visit: Payer: Self-pay | Admitting: Family Medicine

## 2020-10-28 DIAGNOSIS — I10 Essential (primary) hypertension: Secondary | ICD-10-CM

## 2020-11-02 ENCOUNTER — Telehealth: Payer: Self-pay | Admitting: Pulmonary Disease

## 2020-11-02 DIAGNOSIS — G4733 Obstructive sleep apnea (adult) (pediatric): Secondary | ICD-10-CM

## 2020-11-02 DIAGNOSIS — Z9989 Dependence on other enabling machines and devices: Secondary | ICD-10-CM

## 2020-11-02 NOTE — Telephone Encounter (Signed)
Dr. Elsworth Soho you saw this patient on 12/19/19  Prescription for auto CPAP 10 to 20 cm with large AirFit F30 full facemask will be sent to DME We will schedule follow-up in 1 month so that we can review report and tweak settings if required  Question from [place] with [employer] [facility],   We need to see if the MD will review this order for a LUNA cpap per patient request.  If Dr is will to approve Wynonia Musty we will be able to set up quicker.   Thank you,  Demetrius Charity

## 2020-11-03 NOTE — Telephone Encounter (Signed)
Anita, please advise. Thanks 

## 2020-11-03 NOTE — Telephone Encounter (Signed)
Will a new order need to be placed for th [place]?

## 2020-11-10 NOTE — Telephone Encounter (Signed)
I would say yes since the order is almost a year old and it needs to state ok for Luna in the order

## 2020-11-10 NOTE — Telephone Encounter (Signed)
Per Dr Elsworth Soho: I am okay with his getting a LUNA device as long as he is given all information about it and comparison with standard brands.    I called and spoke with the pt  He is fine with LUNA- already spoke with [employer] about it  I sent the order  Nothing further needed

## 2020-12-14 ENCOUNTER — Other Ambulatory Visit: Payer: Self-pay | Admitting: Family Medicine

## 2020-12-14 DIAGNOSIS — I10 Essential (primary) hypertension: Secondary | ICD-10-CM

## 2021-02-03 ENCOUNTER — Other Ambulatory Visit: Payer: Self-pay | Admitting: Family Medicine

## 2021-02-03 DIAGNOSIS — I10 Essential (primary) hypertension: Secondary | ICD-10-CM

## 2021-03-23 ENCOUNTER — Encounter: Payer: Self-pay | Admitting: Family Medicine

## 2021-03-23 ENCOUNTER — Other Ambulatory Visit: Payer: Self-pay

## 2021-03-23 ENCOUNTER — Ambulatory Visit (INDEPENDENT_AMBULATORY_CARE_PROVIDER_SITE_OTHER): Payer: Managed Care, Other (non HMO) | Admitting: Family Medicine

## 2021-03-23 VITALS — Ht 73.0 in | Wt 299.0 lb

## 2021-03-23 DIAGNOSIS — T148XXA Other injury of unspecified body region, initial encounter: Secondary | ICD-10-CM | POA: Diagnosis not present

## 2021-03-23 DIAGNOSIS — I10 Essential (primary) hypertension: Secondary | ICD-10-CM

## 2021-03-23 DIAGNOSIS — M25512 Pain in left shoulder: Secondary | ICD-10-CM | POA: Diagnosis not present

## 2021-03-23 MED ORDER — LISINOPRIL 30 MG PO TABS: 30.0000 mg | ORAL_TABLET | Freq: Every day | ORAL | 0 refills | Status: DC

## 2021-03-23 NOTE — Progress Notes (Signed)
° ° °  SUBJECTIVE:   CHIEF COMPLAINT / HPI:   Patient presents with left shoulder and side pain that started about 2 weeks ago. Describes pain as an aching sensation that hurts the most below his armpit. Denies radiating pain, numbness and tingling. Pain is constant. Aggravating factors include movement and relieving factors include rest, typically feels better over the weekends if any but then starts up again during the week when he goes back to work. Pain is worse sometimes when he takes a deep breath. Denies recent trauma or injury. 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   PERTINENT  PMH / PSH:   Hypertension Denies chest pain and dyspnea. He does not have a blood pressure cuff at home and thus does not check his blood pressures but is able to get one and his father has one as well. Compliant on hydrochlorothiazide daily but denies taking lisinopril as prescribed. Denies any associated concerns.   OBJECTIVE:   Ht 6\' 1"  (1.854 m)    Wt 299 lb (135.6 kg)    BMI 39.45 kg/m   General: Patient well-appearing, in no acute distress. CV: RRR, no murmurs or gallops auscultated Resp: CTAB, no wheezing, rales or rhonchi noted MSK: Inspection reveals no gross deformity, edema or erythema noted. Palpation along affected area elicited point tenderness directly inferior to left axilla. Full range of motion along shoulder and elbow joints bilaterally. Special testing reveals negative Yergason's, subscapularis, Hawkin's and Neer testing Ext: radial pulses strong and equal bilaterally, no LE edema noted bilaterally  Neuro: gross sensation intact, 5/5 UE strength, CN 11 grossly intact, 2+ biceps and brachioradialis reflex Psych: mood appropriate   ASSESSMENT/PLAN:   Muscle strain -likely secondary to muscle strain. Low concern for fracture or dislocation. Encouraged resting to allow for appropriate healing and resolution of pain. Application of ice along  with wearing appropriate support during work hours encouraged as well.  -follow up in 2 weeks, if pain persists then consider imaging   Essential hypertension -BP 154/93, not at goal, takes HCTZ nightly and has not taken it yet  -lisinopril refills prescribed -instructed to obtain BP cuff and check BP daily, maintain BP log, guidelines discussed and instructed to bring to next visit  -follow up in 2 weeks for BP follow up, consider obtaining BMP at this time to ensure no electrolyte abnormalities   -PHQ-9 score of 7 with negative question 9 reviewed.   Donney Dice, [place]

## 2021-03-23 NOTE — Patient Instructions (Signed)
It was great seeing you today!  Today we discussed your blood pressure and shoulder pain. Your blood pressure is elevated, I have prescribed lisinopril. Please take both your hydrochlorothiazide and lisinopril daily. Please record your blood pressures 1-2 times daily and bring this log into your next visit. Make sure to be resting for at least 10-15 minutes, feet flat on the floor and resting your arm when checking so that we get an accurate reading.   Your shoulder and side pain is likely due to muscle strain. Please apply ice and take tylenol as needed for pain. Make sure to rest this area so that it can completely heal.   Please follow up at your next scheduled appointment in 2 weeks, if anything arises between now and then, please don't hesitate to contact our office.   Thank you for allowing [place] to be a part of your medical care!  Thank you, Dr. Larae Grooms

## 2021-03-23 NOTE — Assessment & Plan Note (Signed)
-  likely secondary to muscle strain. Low concern for fracture or dislocation. Encouraged resting to allow for appropriate healing and resolution of pain. Application of ice along with wearing appropriate support during work hours encouraged as well.  -follow up in 2 weeks, if pain persists then consider imaging

## 2021-03-23 NOTE — Assessment & Plan Note (Addendum)
-  BP 154/93, not at goal, takes HCTZ nightly and has not taken it yet  -lisinopril refills prescribed -instructed to obtain BP cuff and check BP daily, maintain BP log, guidelines discussed and instructed to bring to next visit  -follow up in 2 weeks for BP follow up, consider obtaining BMP at this time to ensure no electrolyte abnormalities

## 2021-04-20 ENCOUNTER — Other Ambulatory Visit: Payer: Self-pay

## 2021-04-20 ENCOUNTER — Ambulatory Visit (INDEPENDENT_AMBULATORY_CARE_PROVIDER_SITE_OTHER): Payer: Managed Care, Other (non HMO) | Admitting: Family Medicine

## 2021-04-20 ENCOUNTER — Encounter: Payer: Self-pay | Admitting: Family Medicine

## 2021-04-20 VITALS — BP 122/73 | HR 98 | Ht 73.0 in | Wt 302.2 lb

## 2021-04-20 DIAGNOSIS — Z114 Encounter for screening for human immunodeficiency virus [HIV]: Secondary | ICD-10-CM | POA: Diagnosis not present

## 2021-04-20 DIAGNOSIS — Z1159 Encounter for screening for other viral diseases: Secondary | ICD-10-CM

## 2021-04-20 DIAGNOSIS — I10 Essential (primary) hypertension: Secondary | ICD-10-CM | POA: Diagnosis not present

## 2021-04-20 NOTE — Assessment & Plan Note (Deleted)
-  BP 122/73, at goal -continue current antihypertensive regimen of HCTZ and lisinopril -due for BMP which we will obtain today to ensure electrolytes are within normal limits  -diet and exercise counseling provided

## 2021-04-20 NOTE — Progress Notes (Signed)
° ° °  SUBJECTIVE:   CHIEF COMPLAINT / HPI:   Patient presents for BP check. Endorses compliance on HCTZ and lisinopril. Got a BP cuff at home, BP ranges on average 125/70s. Denies chest pain, worsening dyspnea and leg swelling. Denies vision changes, headaches, cough or weakness. Loves to eat vegetables and tries to stay active at work.   OBJECTIVE:   BP 122/73    Pulse 98    Ht 6\' 1"  (1.854 m)    Wt (!) 302 lb 3.2 oz (137.1 kg)    SpO2 98%    BMI 39.87 kg/m   General: Patient well-appearing, in no acute distress. CV: RRR, no murmurs or gallops auscultated Resp: CTAB Ext: no LE edema noted bilaterally   ASSESSMENT/PLAN:   Essential hypertension -BP 122/73, at goal -continue current antihypertensive regimen of HCTZ and lisinopril -due for BMP which we will obtain today to ensure electrolytes are within normal limits  -diet and exercise counseling provided    Patient agreeable to HIV and Hepatitis C screening, will notify patient of any abnormal results.   -PHQ-9 score of 8 with negative question 9 reviewed.   Donney Dice, [place]

## 2021-04-20 NOTE — Assessment & Plan Note (Signed)
-  BP 122/73, at goal -continue current antihypertensive regimen of HCTZ and lisinopril -due for BMP which we will obtain today to ensure electrolytes are within normal limits  -diet and exercise counseling provided

## 2021-04-20 NOTE — Patient Instructions (Signed)
It was great seeing you today!  Today we discussed your blood pressure which looks great. Please keep taking your blood pressure medications as prescribed. Make sure to continue to eat a balanced diet and stay active throughout the day.   We did blood work to check your electrolytes are within a normal range.We also got an HIV and Hepatitis C screening.  I will let you know of any abnormal blood work.   Please follow up at your next scheduled appointment in 6 months, if anything arises between now and then, please don't hesitate to contact our office.   Thank you for allowing [place] to be a part of your medical care!  Thank you, Dr. Larae Grooms

## 2021-04-21 LAB — BASIC METABOLIC PANEL
BUN/Creatinine Ratio: 21 — ABNORMAL HIGH (ref 9–20)
BUN: 16 mg/dL (ref 6–20)
CO2: 23 mmol/L (ref 20–29)
Calcium: 9.7 mg/dL (ref 8.7–10.2)
Chloride: 101 mmol/L (ref 96–106)
Creatinine, Ser: 0.75 mg/dL — ABNORMAL LOW (ref 0.76–1.27)
Glucose: 116 mg/dL — ABNORMAL HIGH (ref 70–99)
Potassium: 3.9 mmol/L (ref 3.5–5.2)
Sodium: 139 mmol/L (ref 134–144)
eGFR: 119 mL/min/{1.73_m2} (ref >59)

## 2021-04-21 LAB — HIV ANTIBODY (ROUTINE TESTING W REFLEX): HIV Screen 4th Generation wRfx: NONREACTIVE

## 2021-04-21 LAB — HCV INTERPRETATION

## 2021-04-21 LAB — HCV AB W REFLEX TO QUANT PCR: HCV Ab: NONREACTIVE

## 2021-05-05 IMAGING — US US ABDOMEN LIMITED
1 series · 14 of 25 positions shown · non-contrast
Comparison: Abdominal ultrasound 05/12/2016

CLINICAL DATA: Nausea and vomiting for 2 weeks.

EXAM:
ULTRASOUND ABDOMEN LIMITED RIGHT UPPER QUADRANT

[Series 1: us abdomen limited · 0.19mm/px · 14 of 54 slices shown]
[im 1/54]
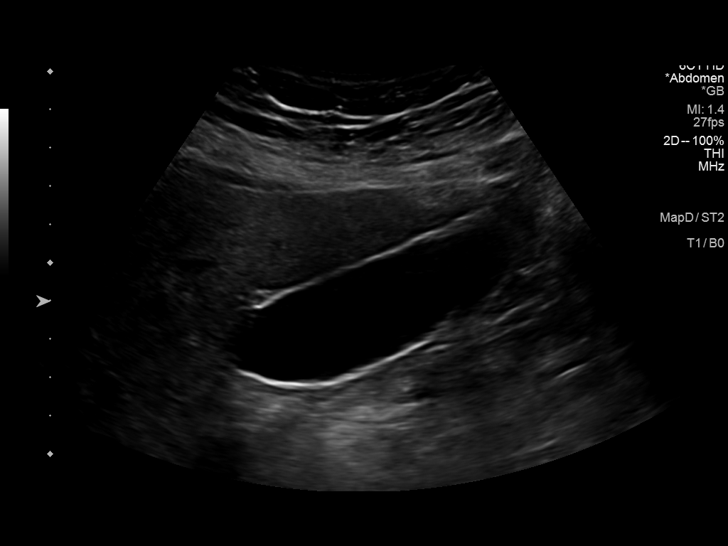
[im 5/54]
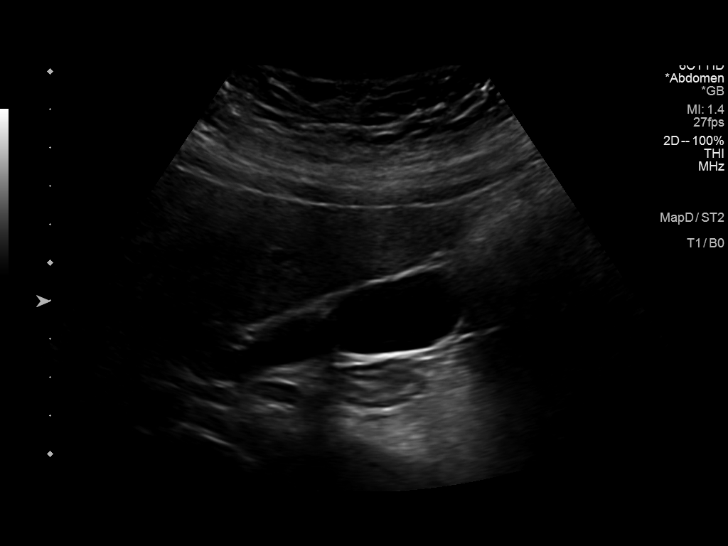
[im 9/54]
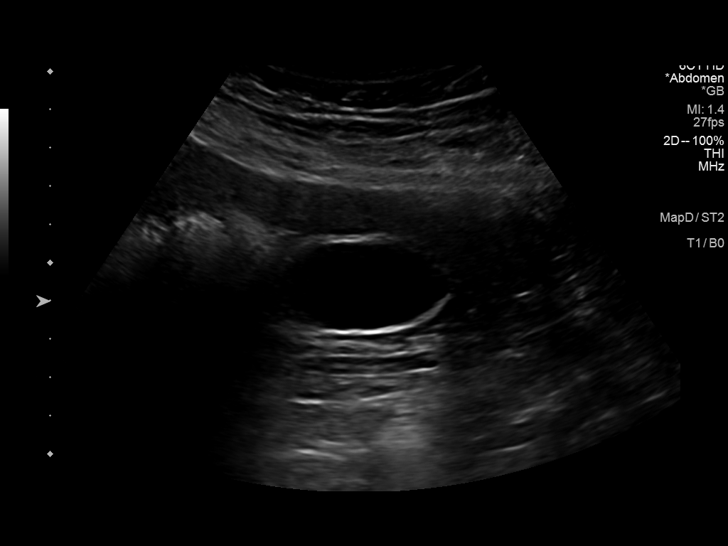
[im 14/54]
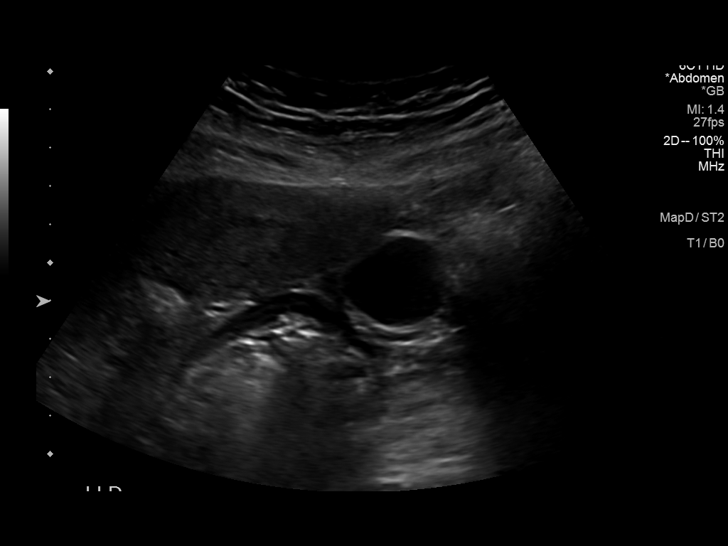
[im 18/54]
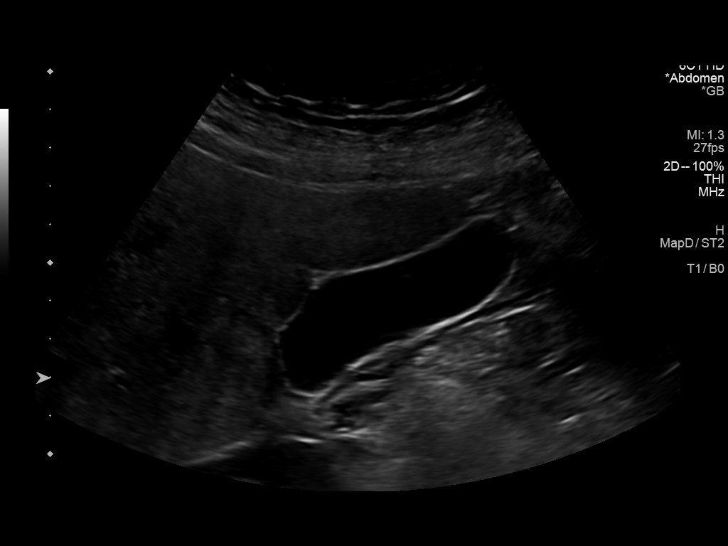
[im 20/54]
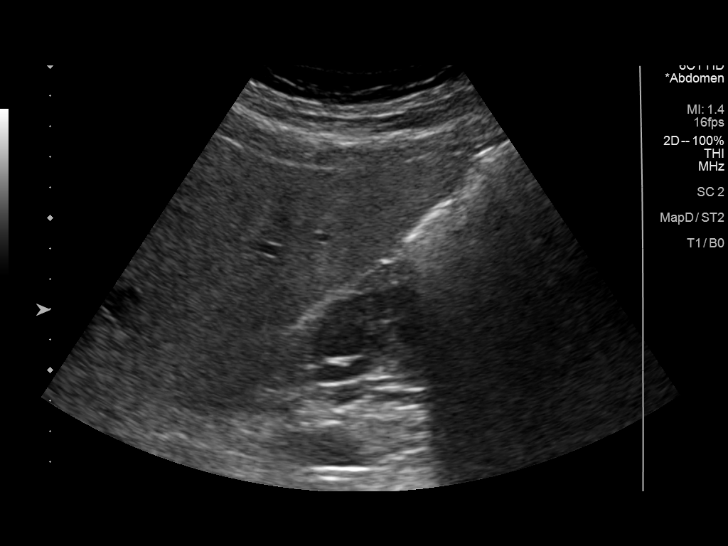
[im 25/54]
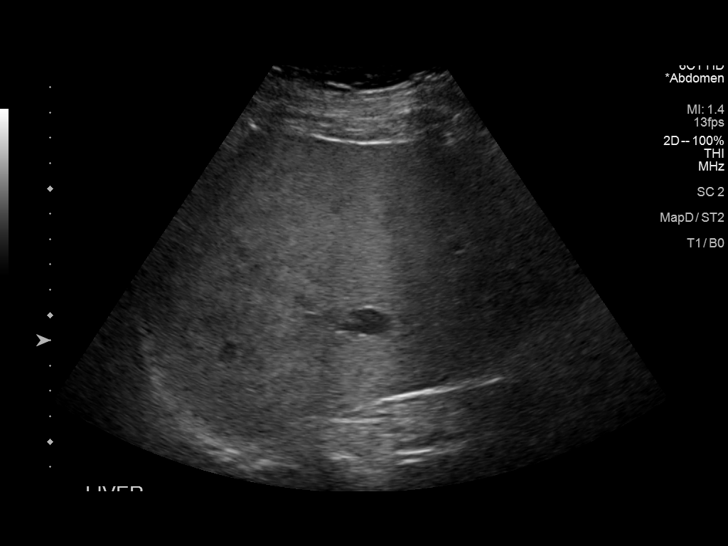
[im 29/54]
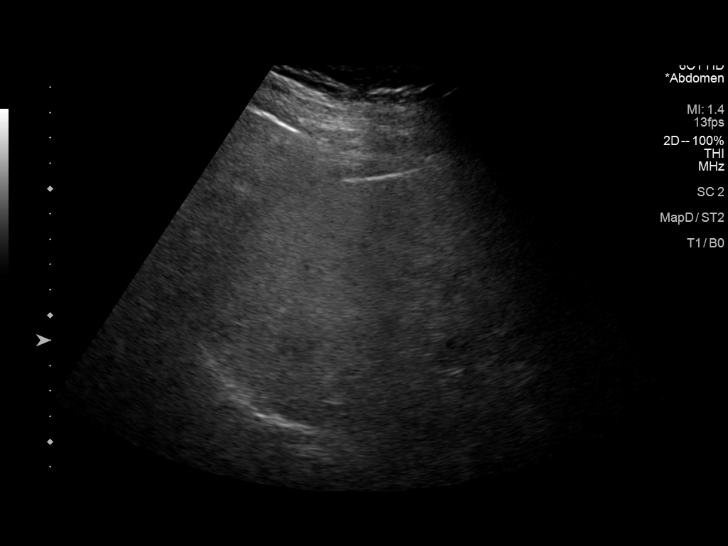
[im 34/54]
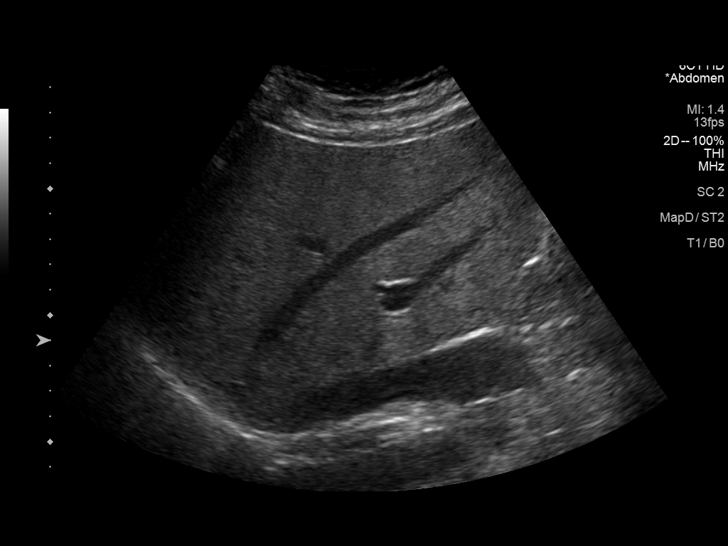
[im 36/54]
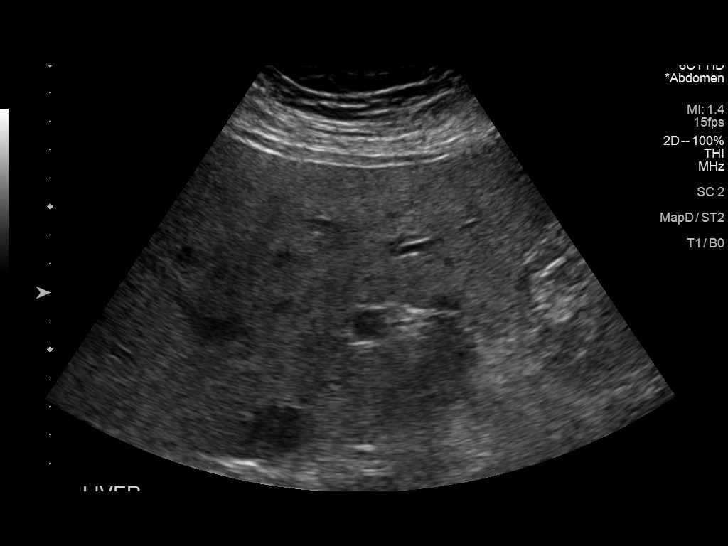
[im 40/54]
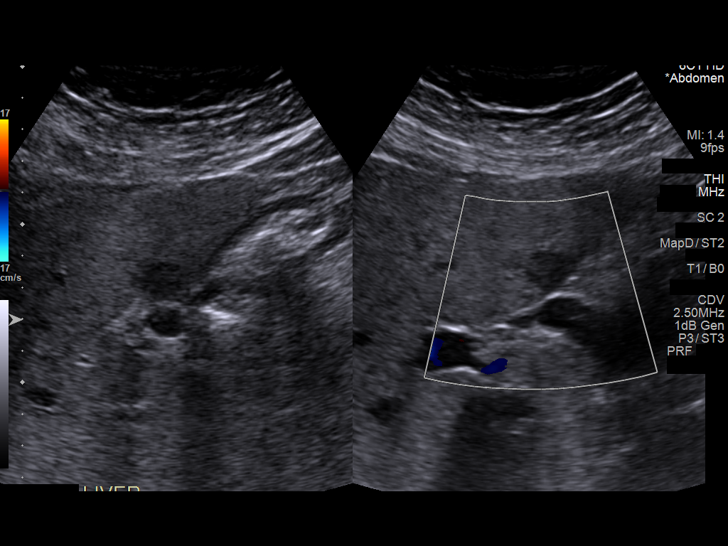
[im 45/54]
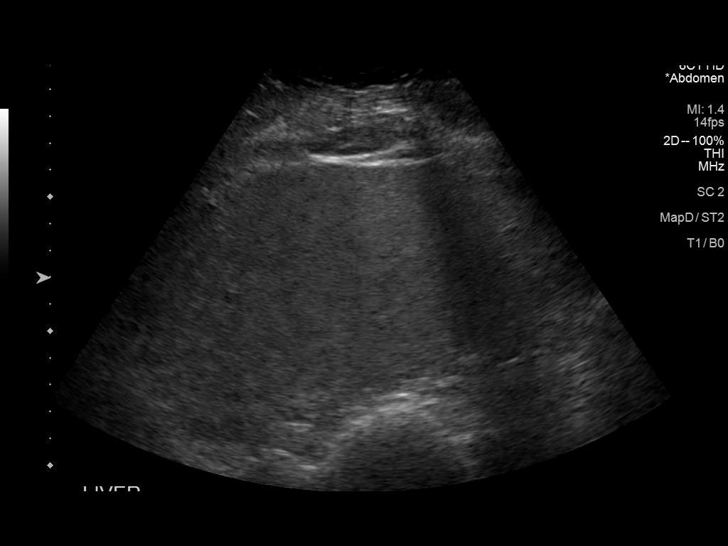
[im 49/54]
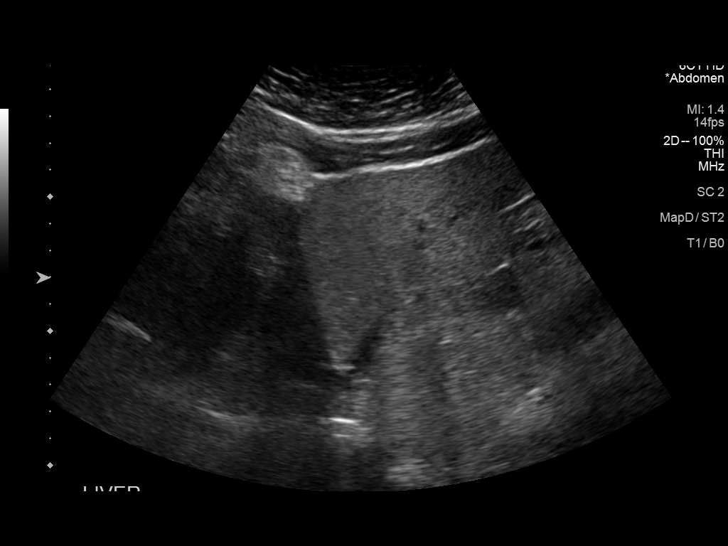
[im 54/54]
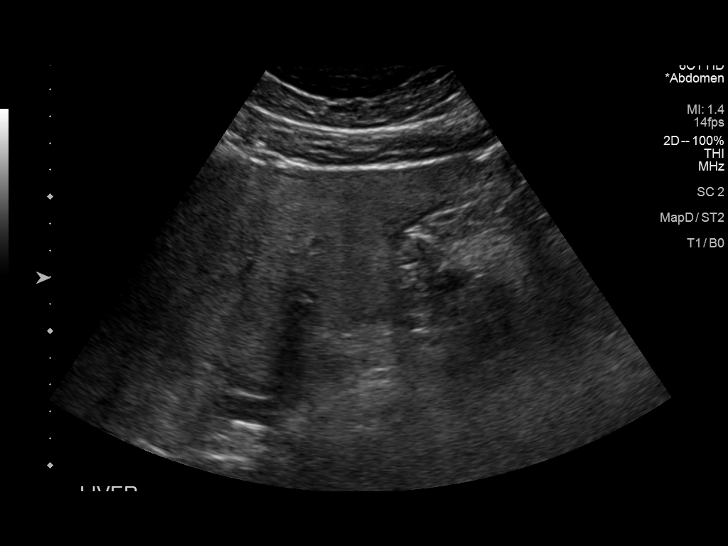

[14 of 25 positions shown; findings below may reference images not displayed]

FINDINGS: Gallbladder:

No gallstones or wall thickening visualized. No sonographic Murphy
sign noted by sonographer.

Common bile duct:

Diameter: 0.3 cm, within normal limits.

Liver:

Liver parenchymal echogenicity is diffusely increased. There is a
hypoechoic area adjacent to the gallbladder fossa measuring 2.0 x
1.2 x 1.6 cm. Portal vein is patent on color Doppler imaging with
normal direction of blood flow towards the liver.

Other: None.
IMPRESSION: Diffusely increased liver parenchymal echogenicity which is
nonspecific but most commonly seen with hepatic steatosis. There is
a hypoechoic area measuring 2.0 cm adjacent the gallbladder fossa
which is technically indeterminate though favored to represent focal
fatty sparing. Consider MRI of the liver for definitive evaluation.

## 2021-06-16 ENCOUNTER — Other Ambulatory Visit: Payer: Self-pay | Admitting: Family Medicine

## 2021-06-16 DIAGNOSIS — I10 Essential (primary) hypertension: Secondary | ICD-10-CM

## 2021-06-27 ENCOUNTER — Other Ambulatory Visit: Payer: Self-pay | Admitting: Family Medicine

## 2021-06-27 DIAGNOSIS — I10 Essential (primary) hypertension: Secondary | ICD-10-CM

## 2021-06-28 ENCOUNTER — Emergency Department (HOSPITAL_COMMUNITY): Payer: Managed Care, Other (non HMO)

## 2021-06-28 ENCOUNTER — Other Ambulatory Visit: Payer: Self-pay

## 2021-06-28 ENCOUNTER — Encounter (HOSPITAL_COMMUNITY): Payer: Self-pay

## 2021-06-28 ENCOUNTER — Emergency Department (HOSPITAL_COMMUNITY)
Admission: EM | Admit: 2021-06-28 | Discharge: 2021-06-28 | Disposition: A | Discharge status: Home / Self Care | Payer: Managed Care, Other (non HMO) | Attending: Emergency Medicine | Admitting: Emergency Medicine

## 2021-06-28 DIAGNOSIS — S46912A Strain of unspecified muscle, fascia and tendon at shoulder and upper arm level, left arm, initial encounter: Secondary | ICD-10-CM | POA: Insufficient documentation

## 2021-06-28 DIAGNOSIS — X500XXA Overexertion from strenuous movement or load, initial encounter: Secondary | ICD-10-CM | POA: Diagnosis not present

## 2021-06-28 DIAGNOSIS — Y9269 Other specified industrial and construction area as the place of occurrence of the external cause: Secondary | ICD-10-CM | POA: Insufficient documentation

## 2021-06-28 DIAGNOSIS — Y990 Civilian activity done for income or pay: Secondary | ICD-10-CM | POA: Diagnosis not present

## 2021-06-28 DIAGNOSIS — S4992XA Unspecified injury of left shoulder and upper arm, initial encounter: Secondary | ICD-10-CM | POA: Diagnosis present

## 2021-06-28 MED ORDER — LIDOCAINE 5 % EX PTCH
1.0000 | MEDICATED_PATCH | CUTANEOUS | Status: DC
  Administered 2021-06-28: 1 via TRANSDERMAL
  Filled 2021-06-28: qty 1

## 2021-06-28 NOTE — ED Notes (Signed)
I provided reinforced discharge education based off of discharge instructions. Pt acknowledged and understood my education. Pt had no further questions/concerns for provider/myself.  °

## 2021-06-28 NOTE — ED Provider Notes (Signed)
?[facility] DEPT ?Provider Note ? ? ?CSN: [id number] ?Arrival date & time: 06/28/21  4132 ? ?  ? ?History ? ?Chief Complaint  ?Patient presents with  ? Shoulder Pain  ? ? ?Juan Grimes is a 36 y.o. male who presents to the ED for evaluation of left shoulder pain.  Patient reports that for the last 2 to 3 days he has had increased left shoulder pain.  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  Patient states that over the last 2 to 3 days the shoulder pain is worsened, he states that the pain is relieved when he takes a couple days off.  Patient denies any OTC medications prior to arrival.  Patient reports that he was seen by his PCP in January for the same complaint and given a diagnosis of muscle strain.  No imaging was conducted at this time.  The patient denies any numbness, tingling, weakness in his left arm.  The patient denies any fevers, obvious swelling of the left shoulder. ? ? ?Shoulder Pain ?Associated symptoms: no fever   ? ?  ? ?Home Medications ?Prior to Admission medications   ?Medication Sig Start Date End Date Taking? Authorizing Provider  ?diazepam (VALIUM) 10 MG tablet Take 10 mg by mouth as needed for anxiety.  02/07/19   [provider]  ?diphenhydrAMINE (BENADRYL) 25 mg capsule Take 1 capsule (25 mg total) by mouth every 6 (six) hours as needed. 05/18/14   Britt Bottom, NP  ?Glucosamine HCl (GLUCOSAMINE PO) Take 1 tablet by mouth at bedtime.    [provider]  ?hydrochlorothiazide (HYDRODIURIL) 25 MG tablet Take 1 tablet by mouth once daily 06/17/21   Ganta, Anupa, DO  ?HYDROcodone-acetaminophen (NORCO/VICODIN) 5-325 MG tablet Take 1 tablet by mouth every 6 (six) hours as needed. 08/13/20   Couture, Cortni S, PA-C  ?lamoTRIgine (LAMICTAL) 200 MG tablet Take 400 mg by mouth at bedtime.  04/06/16   [provider]  ?LATUDA 40 MG TABS tablet Take 40 mg by mouth daily. 11/18/19    [provider]  ?lisinopril (ZESTRIL) 30 MG tablet Take 1 tablet by mouth once daily 06/28/21   Donney Dice, DO  ?Multiple Vitamin (MULTIVITAMIN ADULT PO) Take 1 tablet by mouth at bedtime.    [provider]  ?omeprazole (PRILOSEC) 40 MG capsule Take 1 capsule by mouth once daily 02/19/20   Ganta, Anupa, DO  ?polyethylene glycol powder (GLYCOLAX/MIRALAX) 17 GM/SCOOP powder Take 17 g by mouth 2 (two) times daily as needed for moderate constipation. 12/26/19   Donney Dice, DO  ?   ? ?Allergies    ?Patient has no known allergies.   ? ?Review of Systems   ?Review of Systems  ?Constitutional:  Negative for chills and fever.  ?Gastrointestinal:  Negative for nausea and vomiting.  ?Musculoskeletal:  Positive for arthralgias.  ?Neurological:  Negative for weakness and numbness.  ?All other systems reviewed and are negative. ? ?Physical Exam ?Updated Vital Signs ?BP (!) 154/88 (BP Location: Right Arm)   Pulse 74   Temp 98.1 ?F (36.7 ?C) (Oral)   Resp 18   Ht '6\' 1"'$  (1.854 m)   Wt 136.1 kg   SpO2 98%   BMI 39.58 kg/m?  ?Physical Exam ?Vitals and nursing note reviewed.  ?Constitutional:   ?   General: He is not in acute distress. ?   Appearance: Normal appearance. He is not ill-appearing, toxic-appearing or diaphoretic.  ?HENT:  ?  Head: Normocephalic and atraumatic.  ?   Nose: Nose normal. No congestion.  ?   Mouth/Throat:  ?   Mouth: Mucous membranes are moist.  ?   Pharynx: Oropharynx is clear.  ?Eyes:  ?   Extraocular Movements: Extraocular movements intact.  ?   Conjunctiva/sclera: Conjunctivae normal.  ?   Pupils: Pupils are equal, round, and reactive to light.  ?Cardiovascular:  ?   Rate and Rhythm: Normal rate and regular rhythm.  ?Pulmonary:  ?   Effort: Pulmonary effort is normal.  ?   Breath sounds: Normal breath sounds. No wheezing.  ?Abdominal:  ?   General: Abdomen is flat. Bowel sounds are normal.  ?   Palpations: Abdomen is soft.  ?   Tenderness: There is no abdominal tenderness.   ?Musculoskeletal:  ?   Right shoulder: Normal.  ?   Left shoulder: Tenderness present. No swelling, deformity, effusion or laceration. Decreased range of motion.  ?   Cervical back: Normal range of motion and neck supple. No tenderness.  ?   Comments: Slightly decreased range of motion of patient left shoulder.  Negative empty can test, negative Hawkins test.  ?Skin: ?   General: Skin is warm and dry.  ?   Capillary Refill: Capillary refill takes less than 2 seconds.  ?Neurological:  ?   Mental Status: He is alert and oriented to person, place, and time.  ? ? ?ED Results / Procedures / Treatments   ?Labs ?(all labs ordered are listed, but only abnormal results are displayed) ?Labs Reviewed - No data to display ? ?EKG ?None ? ?Radiology ?DG Shoulder Left ? ?Result Date: 06/28/2021 ?CLINICAL DATA:  Left shoulder pain EXAM: LEFT SHOULDER - 2+ VIEW COMPARISON:  None. FINDINGS: There is no evidence of fracture or dislocation. There is no evidence of arthropathy or other focal bone abnormality. Soft tissues are unremarkable. IMPRESSION: No fracture or dislocation of the left shoulder. Joint spaces are preserved. Electronically Signed   By: Delanna Ahmadi M.D.   On: 06/28/2021 10:38   ? ?Procedures ?Procedures  ? ?Medications Ordered in ED ?Medications  ?lidocaine (LIDODERM) 5 % 1 patch (1 patch Transdermal Patch Applied 06/28/21 1621)  ? ? ?ED Course/ Medical Decision Making/ A&P ?  ?                        ?Medical Decision Making ?Amount and/or Complexity of Data Reviewed ?Radiology: ordered. ? ?Risk ?Prescription drug management. ? ? ?36 year old male presents to ED for evaluation of left shoulder pain.  Please see HPI for further details. ? ?On examination, the patient is afebrile, nontachycardic.  Patient has nonfocal tenderness to his left shoulder.  Patient has 2+ radial pulse to the left wrist.  No obvious deformity, overlying skin change, swelling. ? ?Patient seen at PCP back in January for same complaint and  diagnosed with muscle strain.  Patient advised to follow back up with PCP for ongoing needs.  There is no emergent cause of the patient's left shoulder pain today. ? ?Patient provided with lidocaine patch for pain relief.  Patient advised to use Tylenol, ibuprofen, ice pack, heating pad.  Patient voiced understanding of these instructions.  Patient provided return precautions and he voiced understanding.  The patient had all of his questions answered to his satisfaction.  The patient is stable for discharge home. ? ? ?Final Clinical Impression(s) / ED Diagnoses ?Final diagnoses:  ?Strain of left shoulder, initial encounter  ? ? ?Rx /  DC Orders ?ED Discharge Orders   ? ? None  ? ?  ? ? ?  ?Azucena Cecil, PA-C ?06/28/21 1622 ? ?  ?Milton Ferguson, MD ?06/29/21 1734 ? ?

## 2021-06-28 NOTE — ED Notes (Signed)
Upon return to room, pt could not be located in ED.  ?

## 2021-06-28 NOTE — ED Triage Notes (Addendum)
Patient reports that he began having left shoulder pain x 3 days. Patient states while at work the pain worsened in the left shoulder. Patient states he can not raise his left arm above his head due to pain. Patient denies any known injuries. ?

## 2021-06-28 NOTE — Discharge Instructions (Addendum)
Return to the ED with any new symptom such as fevers nausea and vomiting ?Please follow back up with your PCP for ongoing needs ?Please utilize ice packs, heating pads, ibuprofen or Tylenol for pain relief.  You may also purchase Salonpas patches. ?Please read the attached informational guide concerning overuse syndrome ?

## 2021-08-05 NOTE — Patient Instructions (Incomplete)
Thank you for coming to see me today. It was a pleasure.   Your urine was ***  Use Diclofenac Gel four times a day to the area that is affected  Please follow-up with PCP in 2-3 weeks if no improvement  If you have any questions or concerns, please do not hesitate to call the office at [phone].  Best,   Carollee Leitz, MD
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
## 2021-08-06 ENCOUNTER — Ambulatory Visit (INDEPENDENT_AMBULATORY_CARE_PROVIDER_SITE_OTHER): Payer: Managed Care, Other (non HMO) | Admitting: Family Medicine

## 2021-08-06 ENCOUNTER — Encounter: Payer: Self-pay | Admitting: Family Medicine

## 2021-08-06 VITALS — BP 125/81 | HR 78 | Ht 73.0 in | Wt 317.6 lb

## 2021-08-06 DIAGNOSIS — R1030 Lower abdominal pain, unspecified: Secondary | ICD-10-CM

## 2021-08-06 DIAGNOSIS — N50812 Left testicular pain: Secondary | ICD-10-CM | POA: Diagnosis not present

## 2021-08-06 LAB — POCT URINALYSIS DIP (CLINITEK)
Bilirubin, UA: NEGATIVE
Blood, UA: NEGATIVE
Glucose, UA: NEGATIVE mg/dL
Ketones, POC UA: NEGATIVE mg/dL
Nitrite, UA: NEGATIVE
POC PROTEIN,UA: 30 — AB
Spec Grav, UA: 1.015 (ref 1.010–1.025)
Urobilinogen, UA: 1 E.U./dL
pH, UA: 7.5 (ref 5.0–8.0)

## 2021-08-06 MED ORDER — MELOXICAM 15 MG PO TABS: 15.0000 mg | ORAL_TABLET | Freq: Every day | ORAL | 0 refills | Status: AC

## 2021-08-08 LAB — URINE CULTURE: Organism ID, Bacteria: NO GROWTH

## 2021-08-09 ENCOUNTER — Encounter: Payer: Self-pay | Admitting: Family Medicine

## 2021-08-09 ENCOUNTER — Ambulatory Visit
Admission: RE | Admit: 2021-08-09 | Discharge: 2021-08-09 | Disposition: A | Discharge status: Home / Self Care | Payer: Managed Care, Other (non HMO) | Source: Ambulatory Visit | Attending: Family Medicine | Admitting: Family Medicine

## 2021-08-09 DIAGNOSIS — N50812 Left testicular pain: Secondary | ICD-10-CM
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
## 2021-08-10 ENCOUNTER — Encounter: Payer: Self-pay | Admitting: *Deleted

## 2021-08-10 ENCOUNTER — Other Ambulatory Visit: Payer: Self-pay | Admitting: Family Medicine

## 2021-08-11 ENCOUNTER — Other Ambulatory Visit: Payer: Self-pay | Admitting: Family Medicine

## 2021-08-11 MED ORDER — LEVOFLOXACIN 500 MG PO TABS: 500.0000 mg | ORAL_TABLET | Freq: Every day | ORAL | 0 refills | Status: DC

## 2021-08-11 NOTE — Progress Notes (Signed)
Discussed results of scrotal ultrasound with patient and need to treated with antibiotics.   Script sent for Levofloxacin 500 mg daily x 7 days. He is to follow up in clinic in 1 week and may need to complete full 10 day course of antibiotics.    Recent urine culture negative. Reports has never been sexually active, including oral and anal.  Reports no seizure activity since 2005.  He is aware to monitor for any seizure activity as antibiotic can lower seizure threshold.  Carollee Leitz, MD [facility]

## 2021-08-17 ENCOUNTER — Other Ambulatory Visit: Payer: Self-pay | Admitting: Family Medicine

## 2021-08-17 DIAGNOSIS — I10 Essential (primary) hypertension: Secondary | ICD-10-CM

## 2021-09-14 ENCOUNTER — Other Ambulatory Visit: Payer: Self-pay

## 2021-09-14 NOTE — Telephone Encounter (Signed)
Opened in error. Juan Grimes, CMA

## 2021-09-15 ENCOUNTER — Other Ambulatory Visit: Payer: Self-pay

## 2021-09-15 DIAGNOSIS — I10 Essential (primary) hypertension: Secondary | ICD-10-CM

## 2021-09-15 MED ORDER — LISINOPRIL 30 MG PO TABS: 30.0000 mg | ORAL_TABLET | Freq: Every day | ORAL | 0 refills | Status: DC

## 2021-09-30 ENCOUNTER — Other Ambulatory Visit: Payer: Self-pay

## 2021-09-30 DIAGNOSIS — I10 Essential (primary) hypertension: Secondary | ICD-10-CM

## 2021-09-30 MED ORDER — HYDROCHLOROTHIAZIDE 25 MG PO TABS: 25.0000 mg | ORAL_TABLET | Freq: Every day | ORAL | 0 refills | Status: DC

## 2021-12-13 ENCOUNTER — Emergency Department (HOSPITAL_COMMUNITY)
Admission: EM | Admit: 2021-12-13 | Discharge: 2021-12-13 | Discharge status: Against Medical Advice | Payer: Managed Care, Other (non HMO) | Attending: Emergency Medicine | Admitting: Emergency Medicine

## 2021-12-13 ENCOUNTER — Other Ambulatory Visit: Payer: Self-pay

## 2021-12-13 ENCOUNTER — Emergency Department (HOSPITAL_COMMUNITY): Payer: Managed Care, Other (non HMO)

## 2021-12-13 ENCOUNTER — Encounter (HOSPITAL_COMMUNITY): Payer: Self-pay | Admitting: *Deleted

## 2021-12-13 DIAGNOSIS — R519 Headache, unspecified: Secondary | ICD-10-CM | POA: Insufficient documentation

## 2021-12-13 DIAGNOSIS — Z5321 Procedure and treatment not carried out due to patient leaving prior to being seen by health care provider: Secondary | ICD-10-CM | POA: Diagnosis not present

## 2021-12-13 DIAGNOSIS — R079 Chest pain, unspecified: Secondary | ICD-10-CM | POA: Insufficient documentation

## 2021-12-13 MED ORDER — ACETAMINOPHEN 500 MG PO TABS
1000.0000 mg | ORAL_TABLET | Freq: Once | ORAL | Status: AC
  Administered 2021-12-13: 1000 mg via ORAL
  Filled 2021-12-13: qty 2
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
## 2022-01-14 ENCOUNTER — Other Ambulatory Visit: Payer: Self-pay

## 2022-01-14 ENCOUNTER — Encounter (HOSPITAL_COMMUNITY): Payer: Self-pay

## 2022-01-14 ENCOUNTER — Emergency Department (HOSPITAL_COMMUNITY)
Admission: EM | Admit: 2022-01-14 | Discharge: 2022-01-14 | Disposition: A | Discharge status: Home / Self Care | Payer: Managed Care, Other (non HMO) | Attending: Emergency Medicine | Admitting: Emergency Medicine

## 2022-01-14 DIAGNOSIS — K047 Periapical abscess without sinus: Secondary | ICD-10-CM | POA: Diagnosis not present

## 2022-01-14 DIAGNOSIS — K0889 Other specified disorders of teeth and supporting structures: Secondary | ICD-10-CM | POA: Diagnosis present

## 2022-01-14 MED ORDER — OXYCODONE-ACETAMINOPHEN 5-325 MG PO TABS
1.0000 | ORAL_TABLET | Freq: Once | ORAL | Status: AC
  Administered 2022-01-14: 1 via ORAL
  Filled 2022-01-14: qty 1

## 2022-01-14 MED ORDER — NAPROXEN 500 MG PO TABS: 500.0000 mg | ORAL_TABLET | Freq: Two times a day (BID) | ORAL | 0 refills | Status: DC

## 2022-01-14 MED ORDER — PENICILLIN V POTASSIUM 250 MG PO TABS
500.0000 mg | ORAL_TABLET | Freq: Once | ORAL | Status: AC
  Administered 2022-01-14: 500 mg via ORAL
  Filled 2022-01-14: qty 2

## 2022-01-14 MED ORDER — PENICILLIN V POTASSIUM 500 MG PO TABS: 500.0000 mg | ORAL_TABLET | Freq: Four times a day (QID) | ORAL | 0 refills | Status: AC

## 2022-01-14 NOTE — ED Triage Notes (Signed)
PT arrived POV from home c/o a toothache that started 3 days ago and then this morning woke up with left sided oral swelling.

## 2022-01-14 NOTE — ED Provider Notes (Signed)
[facility] EMERGENCY DEPARTMENT Provider Note   CSN: [id number] Arrival date & time: 01/14/22  4401     History  Chief Complaint  Patient presents with   Dental Pain    Juan Grimes is a 36 y.o. male.  The history is provided by the patient and medical records.  Dental Pain  36 y.o. [name] with left lower dental pain and facial swelling.  States pain started 3 days ago, woke up this morning with facial swelling.  Denies trouble swallowing.  No fever/chills.  Not currently established with dentist.  Has taken tylenol and motrin PTA without relief.  Home Medications Prior to Admission medications   Medication Sig Start Date End Date Taking? Authorizing Provider  naproxen (NAPROSYN) 500 MG tablet Take 1 tablet (500 mg total) by mouth 2 (two) times daily. 01/14/22  Yes Larene Pickett, PA-C  penicillin v potassium (VEETID) 500 MG tablet Take 1 tablet (500 mg total) by mouth 4 (four) times daily for 10 days. 01/14/22 01/24/22 Yes Larene Pickett, PA-C  diazepam (VALIUM) 10 MG tablet Take 10 mg by mouth as needed for anxiety.  02/07/19   [provider]  diphenhydrAMINE (BENADRYL) 25 mg capsule Take 1 capsule (25 mg total) by mouth every 6 (six) hours as needed. 05/18/14   Britt Bottom, NP  Glucosamine HCl (GLUCOSAMINE PO) Take 1 tablet by mouth at bedtime.    [provider]  hydrochlorothiazide (HYDRODIURIL) 25 MG tablet Take 1 tablet (25 mg total) by mouth daily. 09/30/21   Donney Dice, DO  HYDROcodone-acetaminophen (NORCO/VICODIN) 5-325 MG tablet Take 1 tablet by mouth every 6 (six) hours as needed. 08/13/20   Couture, Cortni S, PA-C  lamoTRIgine (LAMICTAL) 200 MG tablet Take 400 mg by mouth at bedtime.  04/06/16   [provider]  levofloxacin (LEVAQUIN) 500 MG tablet Take 1 tablet (500 mg total) by mouth daily. 08/11/21   Carollee Leitz, MD  lisinopril (ZESTRIL) 30 MG tablet Take 1 tablet (30 mg total) by mouth daily. 09/15/21   Donney Dice, DO  Multiple Vitamin (MULTIVITAMIN ADULT PO) Take 1 tablet by mouth at bedtime.    [provider]  omeprazole (PRILOSEC) 40 MG capsule Take 1 capsule by mouth once daily 02/19/20   Ganta, Anupa, DO  polyethylene glycol powder (GLYCOLAX/MIRALAX) 17 GM/SCOOP powder Take 17 g by mouth 2 (two) times daily as needed for moderate constipation. 12/26/19   Donney Dice, DO      Allergies    Patient has no known allergies.    Review of Systems   Review of Systems  HENT:  Positive for dental problem.   All other systems reviewed and are negative.   Physical Exam Updated Vital Signs BP (!) 159/92 (BP Location: Right Arm)   Pulse 95   Temp 98.5 F (36.9 C) (Oral)   Resp 18   Ht '5\' 9"'$  (1.753 m)   Wt 127 kg   SpO2 95%   BMI 41.35 kg/m   Physical Exam Vitals and nursing note reviewed.  Constitutional:      Appearance: He is well-developed.  HENT:     Head: Normocephalic and atraumatic.     Mouth/Throat:     Comments: Teeth largely in poor dentition, left lower lateral incisor and all molars are broken down to the gums, surrounding gingiva swollen and inflamed without discrete abscess or fluid collection, handling secretions appropriately, no trismus, swelling of left lower cheek without extension into neck, normal phonation without  stridor Eyes:     Conjunctiva/sclera: Conjunctivae normal.     Pupils: Pupils are equal, round, and reactive to light.  Cardiovascular:     Rate and Rhythm: Normal rate and regular rhythm.     Heart sounds: Normal heart sounds.  Pulmonary:     Effort: Pulmonary effort is normal.     Breath sounds: Normal breath sounds.  Abdominal:     General: Bowel sounds are normal.     Palpations: Abdomen is soft.  Musculoskeletal:        General: Normal range of motion.     Cervical back: Normal range of motion.  Skin:    General: Skin is warm and dry.  Neurological:     Mental Status: He is alert and oriented to person, place, and time.      ED Results / Procedures / Treatments   Labs (all labs ordered are listed, but only abnormal results are displayed) Labs Reviewed - No data to display  EKG None  Radiology No results found.  Procedures Procedures    Medications Ordered in ED Medications  penicillin v potassium (VEETID) tablet 500 mg (500 mg Oral Given 01/14/22 0547)  oxyCODONE-acetaminophen (PERCOCET/ROXICET) 5-325 MG per tablet 1 tablet (1 tablet Oral Given 01/14/22 0547)    ED Course/ Medical Decision Making/ A&P                           Medical Decision Making Risk Prescription drug management.   36 y.o. [name] with left sided dental pain and facial swelling.  Afebrile, non-toxic.  Several broken/decayed lower molars with mild swelling of left cheek without extension into neck.  No drainable fluid collection.  Handling secretions, no stridor.  Not clinically concerning for ludwig's angina.  Will start on abx and refer to dentist for follow-up.  Can return here for new concerns/  Final Clinical Impression(s) / ED Diagnoses Final diagnoses:  Dental infection    Rx / DC Orders ED Discharge Orders          Ordered    naproxen (NAPROSYN) 500 MG tablet  2 times daily        01/14/22 0544    penicillin v potassium (VEETID) 500 MG tablet  4 times daily        01/14/22 0544              Larene Pickett, PA-C 31/51/76 1607    Delora Fuel, MD 37/10/62 [phone]

## 2022-01-14 NOTE — Discharge Instructions (Signed)
Take the prescribed medication as directed. Follow-up with dentist as soon as possible.  Dr. Haig Prophet is on call, or you can try one of the other local dental clinics listed. Return to the ED for new or worsening symptoms.

## 2022-02-10 ENCOUNTER — Encounter: Payer: Self-pay | Admitting: Family Medicine

## 2022-02-10 ENCOUNTER — Ambulatory Visit (INDEPENDENT_AMBULATORY_CARE_PROVIDER_SITE_OTHER): Payer: Managed Care, Other (non HMO) | Admitting: Family Medicine

## 2022-02-10 VITALS — BP 116/74 | HR 85 | Ht 69.0 in | Wt 280.0 lb

## 2022-02-10 DIAGNOSIS — I10 Essential (primary) hypertension: Secondary | ICD-10-CM

## 2022-02-10 MED ORDER — HYDROCHLOROTHIAZIDE 25 MG PO TABS: 25.0000 mg | ORAL_TABLET | Freq: Every day | ORAL | 0 refills | Status: DC

## 2022-02-10 MED ORDER — LISINOPRIL 30 MG PO TABS: 30.0000 mg | ORAL_TABLET | Freq: Every day | ORAL | 0 refills | Status: DC

## 2022-02-10 NOTE — Patient Instructions (Addendum)
It was great seeing you today!  Today we discussed your blood pressure. It looks great, please continue to take your lisinopril and hydrochlorothiazide daily. If you start to feel dizzy then please call our clinic.   We also got blood work today, I will let you know of any abnormal results and if so then you may have to see me sooner.  Please follow up at your next scheduled appointment in 1 year, if anything arises between now and then, please don't hesitate to contact our office.   Thank you for allowing [place] to be a part of your medical care!  Thank you, Dr. Larae Grooms  Also a reminder of our clinic's no-show policy. Please make sure to arrive at least 15 minutes prior to your scheduled appointment time. Please try to cancel before 24 hours if you are not able to make it. If you no-show for 2 appointments then you will be receiving a warning letter. If you no-show after 3 visits, then you may be at risk of being dismissed from our clinic. This is to ensure that everyone is able to be seen in a timely manner. Thank you, we appreciate your assistance with this!

## 2022-02-10 NOTE — Assessment & Plan Note (Signed)
-  BP 116/74, at goal -continue lisinopril and HCTZ, refills provided today -pending BMP to ensure electrolytes and renal function are appropriate

## 2022-02-10 NOTE — Progress Notes (Signed)
    SUBJECTIVE:   CHIEF COMPLAINT / HPI:   Patient presents today for a BP check. Does not check BP at home. Compliant on lisinopril and HCTZ daily. Denies chest pain, worsening dyspnea or leg swelling. Denies any concerns at this time other than desiring medication refills. He is agreeable to getting blood work today as well.   OBJECTIVE:   BP 116/74   Pulse 85   Ht '5\' 9"'$  (1.753 m)   Wt 280 lb (127 kg)   SpO2 98%   BMI 41.35 kg/m   General: Patient well-appearing, in no acute distress. CV: RRR, no murmurs or gallops auscultated Resp: CTAB, no wheezing, rales or rhonchi noted Abdomen: soft, nontender, nondistended, presence of bowel sounds  ASSESSMENT/PLAN:   Essential hypertension -BP 116/74, at goal -continue lisinopril and HCTZ, refills provided today -pending BMP to ensure electrolytes and renal function are appropriate   Health maintenance -med rec reviewed and updated appropriately  -pending A1c and lipid panel as a way to risk stratify patient given history of hypertension   Juan Grimes, [place]

## 2022-02-11 LAB — LIPID PANEL
Chol/HDL Ratio: 3.2 ratio (ref 0.0–5.0)
Cholesterol, Total: 149 mg/dL (ref 100–199)
HDL: 46 mg/dL (ref >39)
LDL Chol Calc (NIH): 88 mg/dL (ref 0–99)
Triglycerides: 77 mg/dL (ref 0–149)
VLDL Cholesterol Cal: 15 mg/dL (ref 5–40)

## 2022-02-11 LAB — BASIC METABOLIC PANEL
BUN/Creatinine Ratio: 19 (ref 9–20)
BUN: 14 mg/dL (ref 6–20)
CO2: 21 mmol/L (ref 20–29)
Calcium: 8.9 mg/dL (ref 8.7–10.2)
Chloride: 104 mmol/L (ref 96–106)
Creatinine, Ser: 0.72 mg/dL — ABNORMAL LOW (ref 0.76–1.27)
Glucose: 86 mg/dL (ref 70–99)
Potassium: 4 mmol/L (ref 3.5–5.2)
Sodium: 142 mmol/L (ref 134–144)
eGFR: 120 mL/min/{1.73_m2} (ref >59)

## 2022-02-11 LAB — HEMOGLOBIN A1C
Est. average glucose Bld gHb Est-mCnc: 100 mg/dL
Hgb A1c MFr Bld: 5.1 % (ref 4.8–5.6)

## 2022-05-27 ENCOUNTER — Other Ambulatory Visit: Payer: Self-pay

## 2022-05-27 DIAGNOSIS — I10 Essential (primary) hypertension: Secondary | ICD-10-CM

## 2022-05-27 MED ORDER — HYDROCHLOROTHIAZIDE 25 MG PO TABS: 25.0000 mg | ORAL_TABLET | Freq: Every day | ORAL | 0 refills | Status: DC

## 2022-05-31 ENCOUNTER — Ambulatory Visit (INDEPENDENT_AMBULATORY_CARE_PROVIDER_SITE_OTHER): Payer: Managed Care, Other (non HMO)

## 2022-05-31 ENCOUNTER — Ambulatory Visit
Admission: EM | Admit: 2022-05-31 | Discharge: 2022-05-31 | Disposition: A | Discharge status: Home / Self Care | Payer: Managed Care, Other (non HMO) | Attending: Urgent Care | Admitting: Urgent Care

## 2022-05-31 DIAGNOSIS — S6701XA Crushing injury of right thumb, initial encounter: Secondary | ICD-10-CM

## 2022-05-31 DIAGNOSIS — M79645 Pain in left finger(s): Secondary | ICD-10-CM

## 2022-05-31 DIAGNOSIS — S61011A Laceration without foreign body of right thumb without damage to nail, initial encounter: Secondary | ICD-10-CM

## 2022-05-31 DIAGNOSIS — M79644 Pain in right finger(s): Secondary | ICD-10-CM

## 2022-05-31 MED ORDER — CEPHALEXIN 500 MG PO CAPS: 500.0000 mg | ORAL_CAPSULE | Freq: Three times a day (TID) | ORAL | 0 refills | Status: DC

## 2022-05-31 MED ORDER — NAPROXEN 500 MG PO TABS: 500.0000 mg | ORAL_TABLET | Freq: Two times a day (BID) | ORAL | 0 refills | Status: DC

## 2022-05-31 NOTE — Discharge Instructions (Addendum)
Start Keflex for prevent severe wound infection. Use naproxen for pain and inflammation. Cover the wound when you are at work. Otherwise, leave it open to the air and keep it clean and dry. Do not use ointments or creams.

## 2022-05-31 NOTE — ED Provider Notes (Signed)
[facility] - URGENT CARE CENTER  Note:  This document was prepared using Systems analyst and may include unintentional dictation errors.  MRN: [id number] DOB: 07-08-1985  Subjective:   Juan Grimes is a 37 y.o. male presenting for 1 day history of acute onset right thumb injury, laceration.  Suffered a crush injury from a metal roller today.  Has had pain and swelling.  Tdap is up-to-date.  No current facility-administered medications for this encounter.  Current Outpatient Medications:    diazepam (VALIUM) 10 MG tablet, Take 10 mg by mouth as needed for anxiety. , Disp: , Rfl:    diphenhydrAMINE (BENADRYL) 25 mg capsule, Take 1 capsule (25 mg total) by mouth every 6 (six) hours as needed., Disp: 30 capsule, Rfl: 0   Glucosamine HCl (GLUCOSAMINE PO), Take 1 tablet by mouth at bedtime., Disp: , Rfl:    hydrochlorothiazide (HYDRODIURIL) 25 MG tablet, Take 1 tablet (25 mg total) by mouth daily., Disp: 60 tablet, Rfl: 0   lamoTRIgine (LAMICTAL) 200 MG tablet, Take 400 mg by mouth at bedtime. , Disp: , Rfl:    lisinopril (ZESTRIL) 30 MG tablet, Take 1 tablet (30 mg total) by mouth daily., Disp: 30 tablet, Rfl: 0   Multiple Vitamin (MULTIVITAMIN ADULT PO), Take 1 tablet by mouth at bedtime., Disp: , Rfl:    omeprazole (PRILOSEC) 40 MG capsule, Take 1 capsule by mouth once daily, Disp: 30 capsule, Rfl: 6   polyethylene glycol powder (GLYCOLAX/MIRALAX) 17 GM/SCOOP powder, Take 17 g by mouth 2 (two) times daily as needed for moderate constipation., Disp: 3350 g, Rfl: 1   No Known Allergies  Past Medical History:  Diagnosis Date   Bipolar 1 disorder, manic, moderate (HCC)    HTN (hypertension)    normal renin 2008   Obese    Plantar fasciitis    Seizure ([place])    reported massive seizure in 2008     Past Surgical History:  Procedure Laterality Date   TONSILECTOMY, ADENOIDECTOMY, BILATERAL MYRINGOTOMY AND TUBES     1996    Family History  Problem Relation  Age of Onset   Bipolar disorder Father    Hypertension Father    Fibromyalgia Mother     Social History   Tobacco Use   Smoking status: Never    Passive exposure: Never   Smokeless tobacco: Former    Types: Snuff   Tobacco comments:    using nitcotine pouches  Vaping Use   Vaping Use: Every day  Substance Use Topics   Alcohol use: Yes    Alcohol/week: 2.0 standard drinks of alcohol    Types: 2 Standard drinks or equivalent per week   Drug use: No    ROS   Objective:   Vitals: BP (!) 168/98 (BP Location: Right Arm)   Pulse 84   Temp 98 F (36.7 C) (Oral)   Resp 18   SpO2 98%   Physical Exam Constitutional:      General: He is not in acute distress.    Appearance: Normal appearance. He is well-developed and normal weight. He is not ill-appearing, toxic-appearing or diaphoretic.  HENT:     Head: Normocephalic and atraumatic.     Right Ear: External ear normal.     Left Ear: External ear normal.     Nose: Nose normal.     Mouth/Throat:     Pharynx: Oropharynx is clear.  Eyes:     General: No scleral icterus.  Right eye: No discharge.        Left eye: No discharge.     Extraocular Movements: Extraocular movements intact.  Cardiovascular:     Rate and Rhythm: Normal rate.  Pulmonary:     Effort: Pulmonary effort is normal.  Musculoskeletal:       Hands:     Cervical back: Normal range of motion.  Neurological:     Mental Status: He is alert and oriented to person, place, and time.  Psychiatric:        Mood and Affect: Mood normal.        Behavior: Behavior normal.        Thought Content: Thought content normal.        Judgment: Judgment normal.     DG Finger Thumb Left  Result Date: 05/31/2022 CLINICAL DATA:  Left thumb injury with laceration. EXAM: LEFT THUMB 2+V COMPARISON:  None Available. FINDINGS: There is no evidence of fracture or dislocation. There is no evidence of arthropathy or other focal bone abnormality. Soft tissues are  unremarkable. IMPRESSION: Negative. Electronically Signed   By: Lucienne Capers M.D.   On: 05/31/2022 19:12    PROCEDURE NOTE: laceration repair Verbal consent obtained from patient.  Local anesthesia with thank you 5cc Lidocaine 1% without epinephrine.  Wound explored for tendon, ligament damage. Wound scrubbed with soap and water and rinsed. Wound closed with #4 4-0 Ethilon (simple interrupte) sutures.  Wound cleansed and dressed.  Assessment and Plan :   PDMP not reviewed this encounter.  1. Pain of right thumb   2. Crushing injury of right thumb, initial encounter   3. Laceration of right thumb without foreign body without damage to nail, initial encounter     Laceration repaired successfully. Wound care reviewed. Recommended Tylenol and/or naproxen for pain control. Return-to-clinic precautions discussed, patient verbalized understanding. Otherwise, follow up in 10 days for suture removal. Counseled patient on potential for adverse effects with medications prescribed/recommended today, ER and return-to-clinic precautions discussed, patient verbalized understanding.    Jaynee Eagles, PA-C 05/31/22 2014

## 2022-05-31 NOTE — ED Triage Notes (Signed)
Pt reports laceration in the right thumb with a roller today. Reports pain and swelling.

## 2022-06-06 ENCOUNTER — Other Ambulatory Visit: Payer: Self-pay

## 2022-06-06 DIAGNOSIS — I10 Essential (primary) hypertension: Secondary | ICD-10-CM

## 2022-06-06 MED ORDER — LISINOPRIL 30 MG PO TABS: 30.0000 mg | ORAL_TABLET | Freq: Every day | ORAL | 0 refills | Status: DC

## 2022-07-03 ENCOUNTER — Other Ambulatory Visit: Payer: Self-pay | Admitting: Family Medicine

## 2022-07-03 DIAGNOSIS — I10 Essential (primary) hypertension: Secondary | ICD-10-CM

## 2022-10-04 DIAGNOSIS — F3132 Bipolar disorder, current episode depressed, moderate: Secondary | ICD-10-CM | POA: Diagnosis not present

## 2022-10-04 DIAGNOSIS — F411 Generalized anxiety disorder: Secondary | ICD-10-CM | POA: Diagnosis not present

## 2022-10-04 DIAGNOSIS — F3112 Bipolar disorder, current episode manic without psychotic features, moderate: Secondary | ICD-10-CM | POA: Diagnosis not present

## 2022-11-04 DIAGNOSIS — G4733 Obstructive sleep apnea (adult) (pediatric): Secondary | ICD-10-CM | POA: Diagnosis not present

## 2022-11-21 ENCOUNTER — Ambulatory Visit (INDEPENDENT_AMBULATORY_CARE_PROVIDER_SITE_OTHER): Payer: BC Managed Care – PPO | Admitting: Family Medicine

## 2022-11-21 VITALS — BP 143/87 | HR 88 | Ht 69.0 in | Wt 298.4 lb

## 2022-11-21 DIAGNOSIS — S46011A Strain of muscle(s) and tendon(s) of the rotator cuff of right shoulder, initial encounter: Secondary | ICD-10-CM | POA: Diagnosis not present

## 2022-11-21 DIAGNOSIS — I10 Essential (primary) hypertension: Secondary | ICD-10-CM | POA: Diagnosis not present

## 2022-11-21 MED ORDER — LISINOPRIL 40 MG PO TABS: 40.0000 mg | ORAL_TABLET | Freq: Every day | ORAL | 3 refills | Status: DC

## 2022-11-21 MED ORDER — LISINOPRIL 40 MG PO TABS: 40.0000 mg | ORAL_TABLET | Freq: Every day | ORAL | 3 refills | Status: AC

## 2022-11-21 MED ORDER — HYDROCHLOROTHIAZIDE 25 MG PO TABS: 25.0000 mg | ORAL_TABLET | Freq: Every day | ORAL | 0 refills | Status: DC

## 2022-11-21 MED ORDER — HYDROCHLOROTHIAZIDE 25 MG PO TABS: 25.0000 mg | ORAL_TABLET | Freq: Every day | ORAL | 3 refills | Status: AC

## 2022-11-21 NOTE — Assessment & Plan Note (Signed)
Pressure today in office was 141/91, still not at goal.  Increase lisinopril from 30 mg to 40 mg and instructed him to continue taking these medications.  Will follow-up in 4 weeks at which time it may be appropriate to add amlodipine 5 mg.  Recheck BMP today to monitor potassium levels.

## 2022-11-21 NOTE — Assessment & Plan Note (Signed)
Given full range of motion impeded only by pain and no loss of strength or sensation in the arm believe that this is a repetitive motion rotator cuff strain of the right shoulder.  Recommended rest from exacerbating motion, continued use of over-the-counter cold cream and Voltaren gel to be applied topically as needed.  Provided the patient with a packet of stretches that can help increase mobility and decrease pain.  Recommended follow-up in 4 weeks or sooner if pain persists or gets worse.

## 2022-11-21 NOTE — Progress Notes (Signed)
We discussed her blood pressure   SUBJECTIVE:   CHIEF COMPLAINT / HPI:   Juan Grimes is a pleasant 37 year old male here for follow-up on his blood pressure.  Blood pressure today is not at goal despite adherence to his medications.  He does require refill of both his lisinopril and his hydrochlorothiazide.  He does not smoke but does vape and uses them pouches.  We discussed the importance of limiting the use of nicotine containing substances, but he is not ready to consider quitting as he has a lot of stress in his life and his job right now and the substances help mitigate that stress.  Juan Grimes also reports injuring his right shoulder about 2 weeks ago.  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  He has tried a cold therapy cream which he says provides a lot of relief, but he still has a lot of pain with his daily activities.  Tylenol Aleve and naproxen are all not helpful in relieving his pain.  PERTINENT  PMH / PSH: Hypertension  OBJECTIVE:   BP (!) 143/87   Pulse 88   Ht 5\' 9"  (1.753 m)   Wt 298 lb 6 oz (135.3 kg)   SpO2 99%   BMI 44.06 kg/m   General: A&O, NAD Cardiac: RRR, no m/r/g Respiratory: CTAB, normal WOB, no w/c/r Extremities: NTTP, no peripheral edema.  Left arm grossly normal, full range of motion both active and passive.  Neurovascularly intact.  Right arm full active range of motion with pain on internal and external rotation as well as extension.  No point tenderness along the biceps tendon or at the glenohumeral joint.  5 out of 5 strength bilaterally.  ASSESSMENT/PLAN:   Rotator cuff strain, right, initial encounter Given full range of motion impeded only by pain and no loss of strength or sensation in the arm believe that this is a repetitive motion rotator cuff strain of the right shoulder.  Recommended rest from exacerbating motion,  continued use of over-the-counter cold cream and Voltaren gel to be applied topically as needed.  Provided the patient with a packet of stretches that can help increase mobility and decrease pain.  Recommended follow-up in 4 weeks or sooner if pain persists or gets worse.  HTN (hypertension) Pressure today in office was 141/91, still not at goal.  Increase lisinopril from 30 mg to 40 mg and instructed him to continue taking these medications.  Will follow-up in 4 weeks at which time it may be appropriate to add amlodipine 5 mg.  Recheck BMP today to monitor potassium levels.   Juan Heck, DO [facility]

## 2022-11-21 NOTE — Patient Instructions (Signed)
It was wonderful to see you today!  Your and your shoulder injury.  Your blood pressure is still not at goal so I have increased your lisinopril 30 mg to 40 mg.  We will recheck your blood pressure again in about 4 weeks and if it is still not responding we will discuss adding a third medication.  I am also going to recheck your electrolytes today to make sure that you are not losing too much potassium because of your hydrochlorothiazide.  Your shoulder pain is likely due to a strain of your rotator cuff.  This is the circle of the ligaments that lets your shoulder rotate.  With this type of injury it is important to try and reduce the activity that caused the injury in the first place.  Because of this I would recommend avoiding this shaking motion that you described for about 2 weeks.  You can continue to do any other activities that do not exacerbate your pain during this time.  You should also continue to use topical Voltaren gel and the cold therapy cream being mentioned as needed.  There is also a sheet of rotator cuff exercises that you can do with the end of the day to help keep up mobility and reduce the pain associated with this injury.  Please call [phone] with any questions about today's appointment.   If you need any additional refills, please call your pharmacy before calling the office.  Gerrit Heck, DO Family Medicine

## 2022-11-22 LAB — BASIC METABOLIC PANEL
BUN/Creatinine Ratio: 18 (ref 9–20)
BUN: 17 mg/dL (ref 6–20)
CO2: 24 mmol/L (ref 20–29)
Calcium: 9.2 mg/dL (ref 8.7–10.2)
Chloride: 104 mmol/L (ref 96–106)
Creatinine, Ser: 0.93 mg/dL (ref 0.76–1.27)
Glucose: 88 mg/dL (ref 70–99)
Potassium: 3.9 mmol/L (ref 3.5–5.2)
Sodium: 141 mmol/L (ref 134–144)
eGFR: 107 mL/min/{1.73_m2} (ref >59)

## 2022-12-03 IMAGING — US US SCROTUM W/ DOPPLER COMPLETE
1 series · 13 of 25 positions shown · non-contrast
Comparison: None Available.

CLINICAL DATA: acute scrotal pain

EXAM:
SCROTAL ULTRASOUND
DOPPLER ULTRASOUND OF THE TESTICLES
TECHNIQUE: Complete ultrasound examination of the testicles, epididymis, and
other scrotal structures was performed. Color and spectral Doppler
ultrasound were also utilized to evaluate blood flow to the
testicles.

[Series 1: us scrotum w/ doppler complete · 0.07mm/px · 13 of 50 slices shown]
[im 1/50]
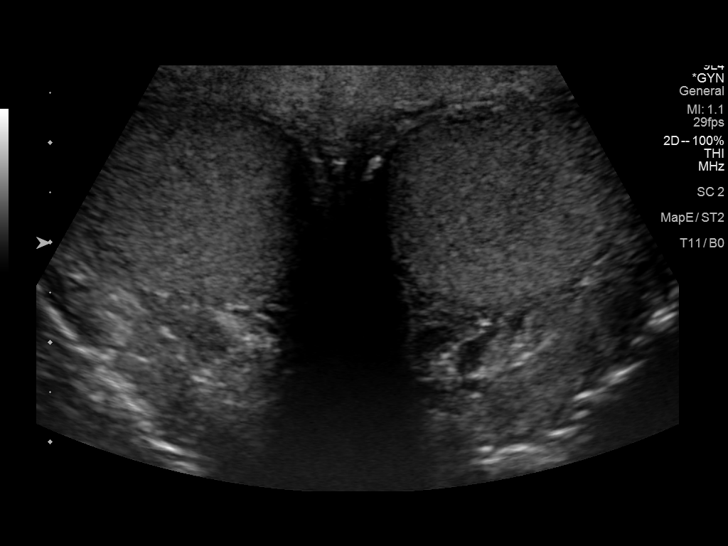
[im 5/50]
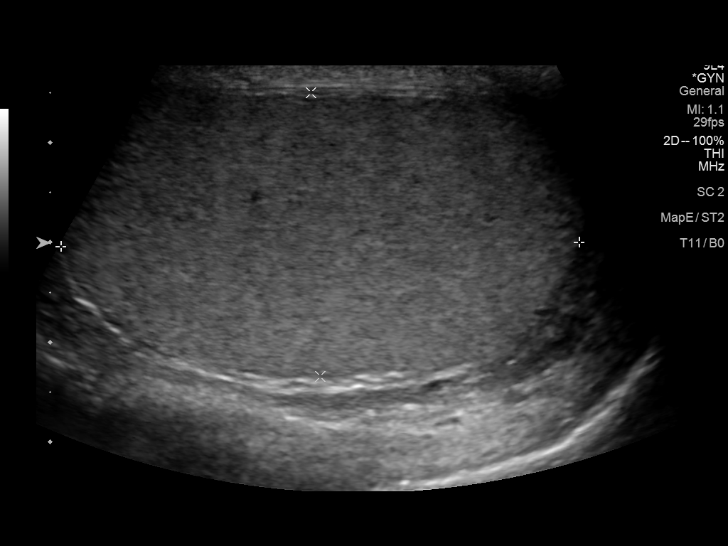
[im 9/50]
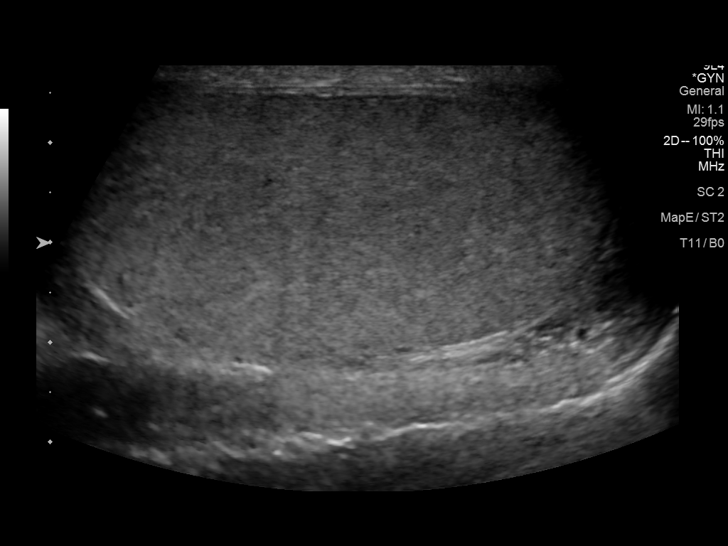
[im 13/50]
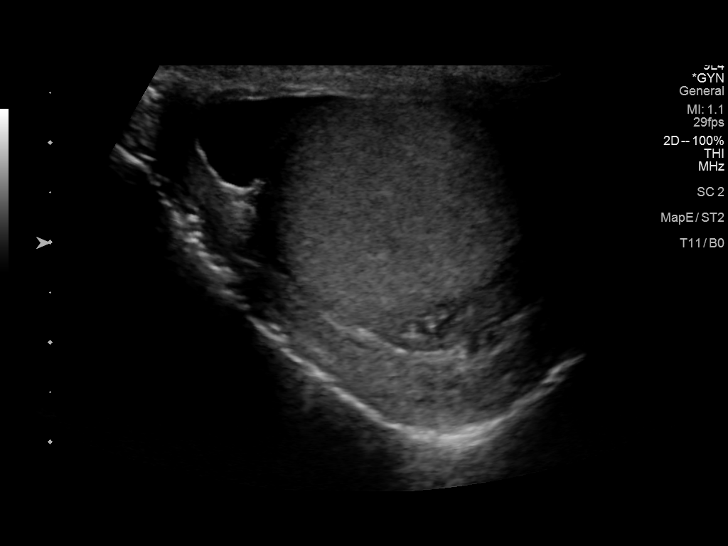
[im 17/50]
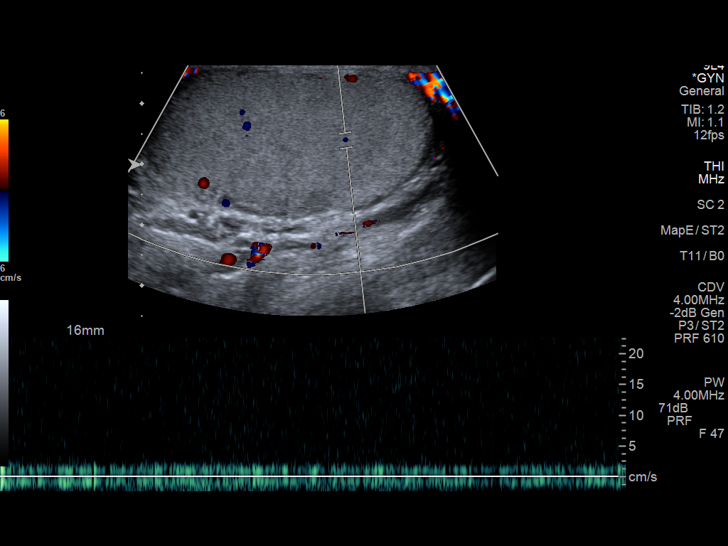
[im 21/50]
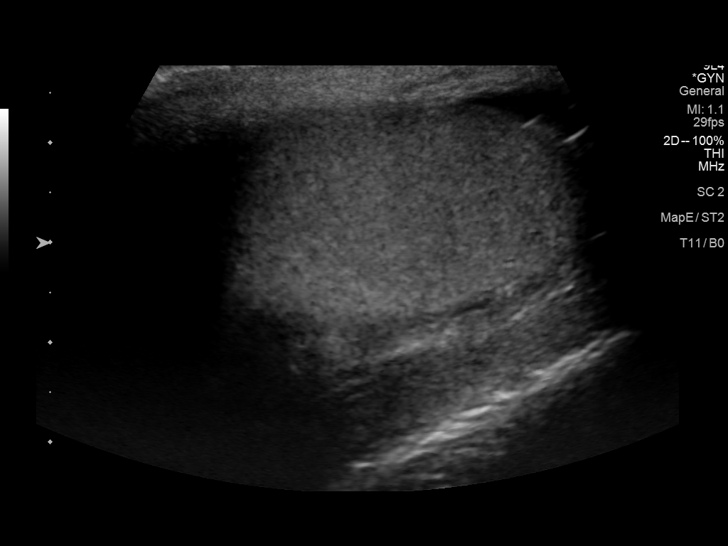
[im 25/50]
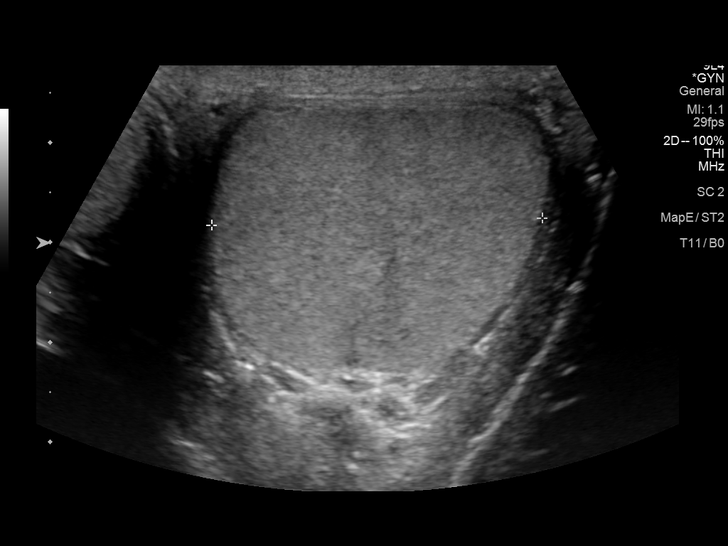
[im 29/50]
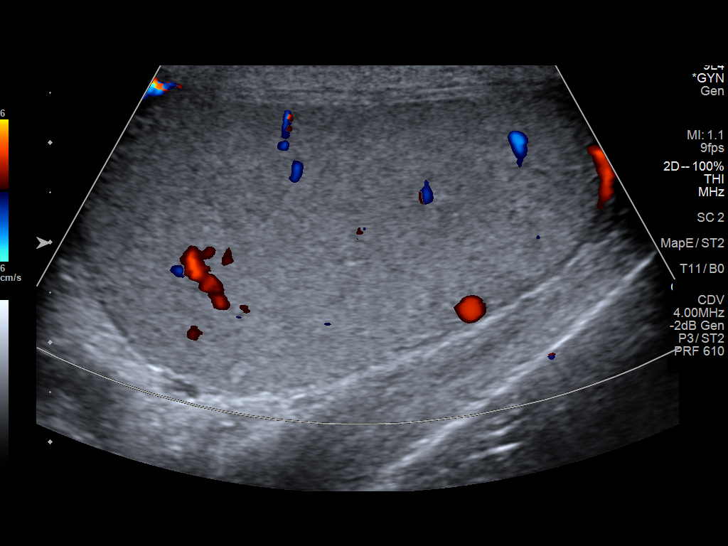
[im 33/50]
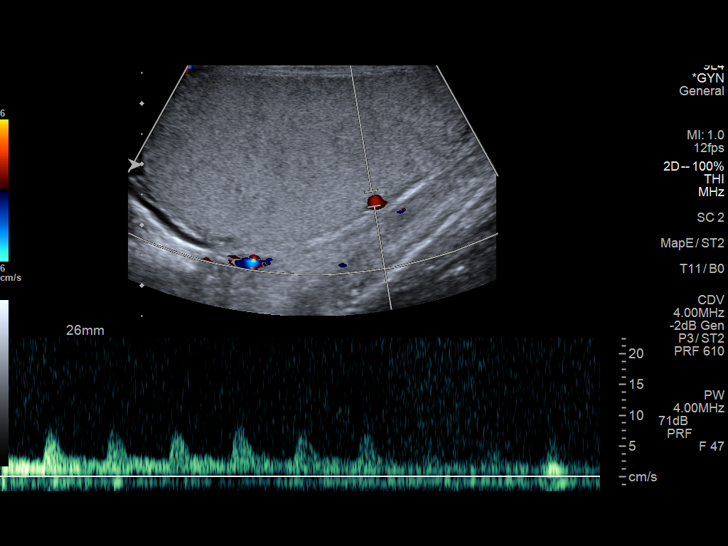
[im 37/50]
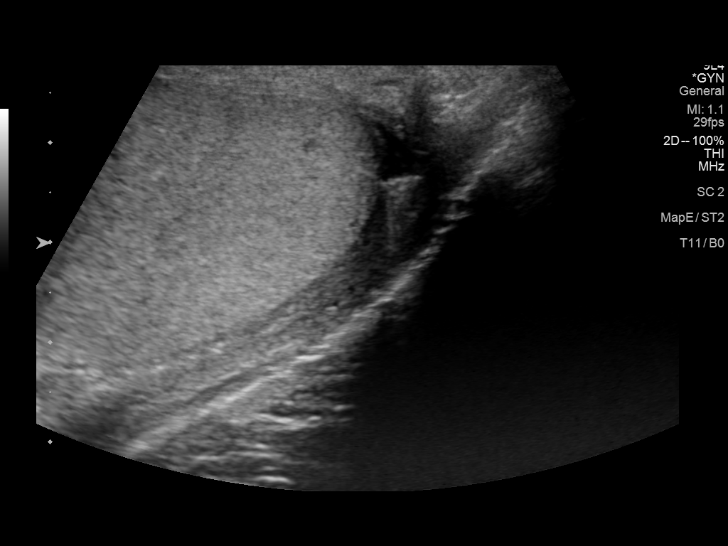
[im 41/50]
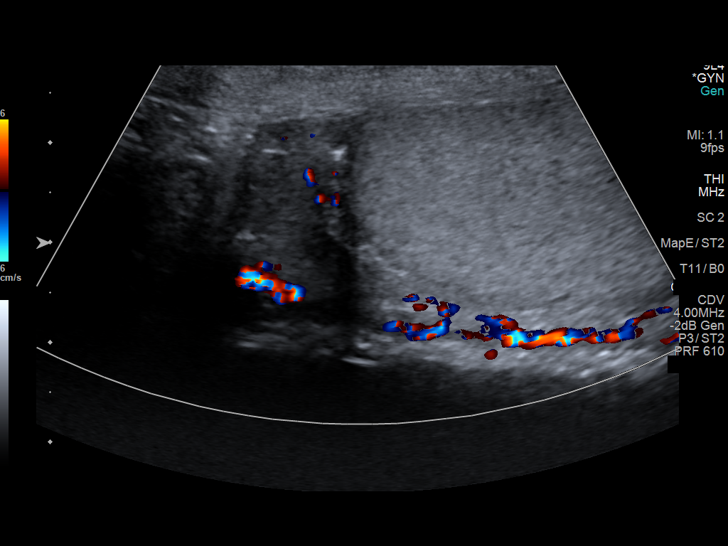
[im 45/50]
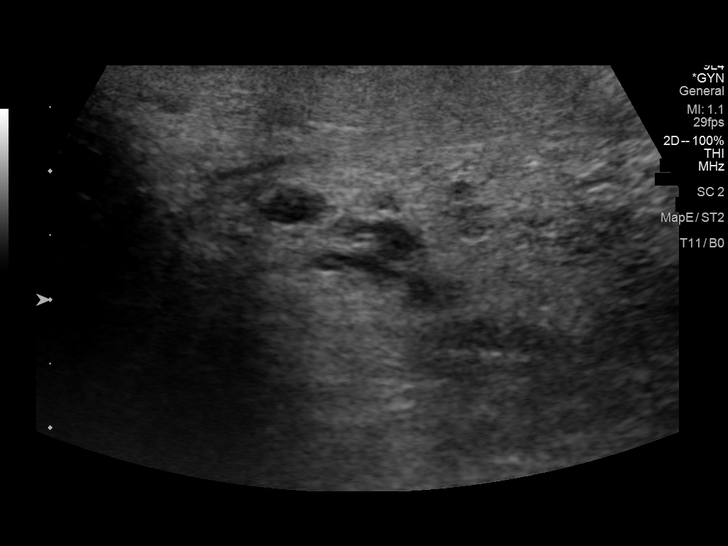
[im 50/50]
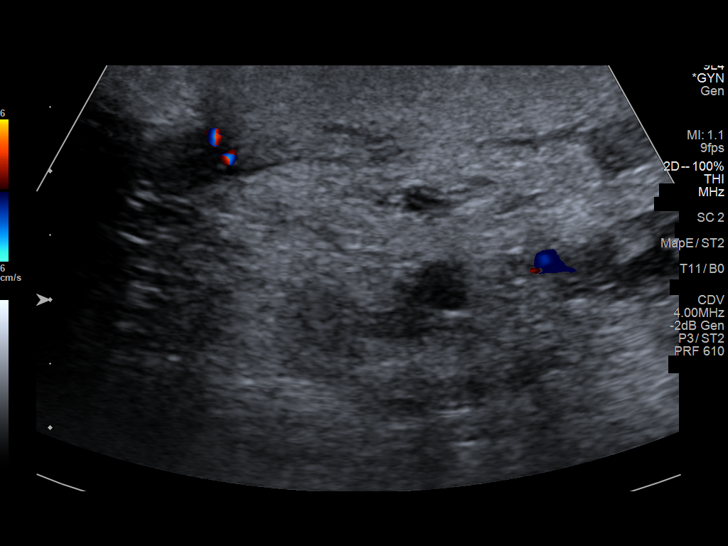

[13 of 25 positions shown; findings below may reference images not displayed]

FINDINGS: Right testicle

Measurements: 5.2 x 2.8 by 3.4 cm. No mass or microlithiasis
visualized.

Left testicle

Measurements: 5.3 x 2.7 by 3.3 cm. No mass or microlithiasis
visualized.

Right epididymis:  Normal in size and appearance.

Left epididymis:  Normal in size and appearance.

Hydrocele:  None visualized.

Varicocele:  None visualized.

Pulsed Doppler interrogation of both testes demonstrates normal low
resistance arterial and venous waveforms bilaterally.

Color-flow ultrasound imaging demonstrates slight increase in the
vascularity at the left side
IMPRESSION: There is apparent slight increase in the vascularity seen at the
left testis/epididymis by the color-flow Doppler ultrasound and is
suspicious for left epididymo-orchitis. Clinical correlation
suggested. No evidence of testicular torsion. No hydrocele.

## 2022-12-05 DIAGNOSIS — G4733 Obstructive sleep apnea (adult) (pediatric): Secondary | ICD-10-CM | POA: Diagnosis not present

## 2022-12-23 ENCOUNTER — Ambulatory Visit: Payer: BC Managed Care – PPO

## 2022-12-23 ENCOUNTER — Other Ambulatory Visit: Payer: Self-pay

## 2022-12-23 ENCOUNTER — Ambulatory Visit: Payer: BC Managed Care – PPO | Admitting: Family Medicine

## 2022-12-23 VITALS — BP 142/78 | HR 86 | Ht 69.0 in | Wt 295.6 lb

## 2022-12-23 DIAGNOSIS — I10 Essential (primary) hypertension: Secondary | ICD-10-CM

## 2022-12-23 DIAGNOSIS — S46011A Strain of muscle(s) and tendon(s) of the rotator cuff of right shoulder, initial encounter: Secondary | ICD-10-CM | POA: Diagnosis not present

## 2022-12-23 NOTE — Progress Notes (Unsigned)
    SUBJECTIVE:   CHIEF COMPLAINT / HPI:   Hypertension  Patient says that he feels fine. Declines labs today.   Shoulder pain  Lifts heavy loads. Thinks it happened at work. Notices when he's moving and lifting at work. He can't lift his arm above his head.   PERTINENT  PMH / PSH: HTN, OSA on CPAP, GERD, Bipolar 1   OBJECTIVE:   BP (!) 90/54   Pulse 91   Ht 5\' 9"  (1.753 m)   Wt 295 lb 9.6 oz (134.1 kg)   SpO2 97%   BMI 43.65 kg/m   General: well appearing, in no acute distress CV: RRR, radial pulses equal and palpable, no BLE edema  Resp: Normal work of breathing on room air, CTAB Abd: Soft, non tender, non distended  Neuro: Alert & Oriented x 4    ASSESSMENT/PLAN:   There are no diagnoses linked to this encounter.    Lockie Mola, MD [facility]

## 2022-12-23 NOTE — Patient Instructions (Signed)
It was wonderful to see you today.  Please bring ALL of your medications with you to every visit.   Today we talked about:  Hypertension - Please continue your lisinopril and your thiazide medications.   Shoulder pain - I think the rotator cuff injury is worse than previously due to more lifting. I would advise resting it. Light stretches are good. You can continue to take naproxen. Take it with food. I have sent a referral to sports medicine and they can do an ultrasound.   Thank you for choosing [facility].   Please call [phone] with any questions about today's appointment.  Juan Mola, MD  Family Medicine

## 2022-12-26 NOTE — Assessment & Plan Note (Signed)
Most likely patient exacerbated rotator cuff strain and might have caused a tear due to continued lifting. - Referral to sports medicine

## 2022-12-26 NOTE — Assessment & Plan Note (Signed)
Hypertension is controlled at this time.  2 measurements were taken during this visit.  Average of these measurements was appropriate. - Continue lisinopril 40 mg and HCTZ 25 mg - Follow-up with PCP in 3 months

## 2023-01-02 ENCOUNTER — Other Ambulatory Visit: Payer: Self-pay

## 2023-01-02 ENCOUNTER — Ambulatory Visit (INDEPENDENT_AMBULATORY_CARE_PROVIDER_SITE_OTHER): Payer: BC Managed Care – PPO | Admitting: Family Medicine

## 2023-01-02 ENCOUNTER — Encounter: Payer: Self-pay | Admitting: Family Medicine

## 2023-01-02 VITALS — BP 138/88 | Ht 69.0 in | Wt 298.0 lb

## 2023-01-02 DIAGNOSIS — M25511 Pain in right shoulder: Secondary | ICD-10-CM

## 2023-01-02 MED ORDER — DICLOFENAC SODIUM 75 MG PO TBEC: 75.0000 mg | DELAYED_RELEASE_TABLET | Freq: Two times a day (BID) | ORAL | 1 refills | Status: DC

## 2023-01-02 NOTE — Patient Instructions (Addendum)
You have rotator cuff impingement Try to avoid painful activities (overhead activities, lifting with extended arm) as much as possible. Diclofenac 75mg  twice a day with food for pain and inflammation as needed. Stop the meloxicam.  Don't take aleve/naproxen or ibuprofen either. Can take tylenol in addition to this. Subacromial injection may be beneficial to help with pain and to decrease inflammation. Consider physical therapy with transition to home exercise program. Do home exercise program with theraband and scapular stabilization exercises daily 3 sets of 10 once a day. If not improving at follow-up we will consider further imaging, injection, physical therapy, and/or nitro patches. Follow up with me in 6 weeks but call me sooner if you're struggling.

## 2023-01-02 NOTE — Progress Notes (Signed)
PCP: Gerrit Heck, DO  Subjective:   HPI: Patient is a 37 y.o. male here for right shoulder pain.  Seen by Dr. Marsh Dolly with concern for rotator cuff strain on 12/23/22. Referred here for further evaluation.  Thinks he injured it at work. Works in Location manager. Cuts rebar. Does lots of loaded overhead movement. Does not remember a specific injury. First noticed 3 months ago. Has progressively been getting worse. Pain is in back of shoulder and radiates anteriorly. Tylenol, Meloxicam and Ibuprofen has not helped. Topical "cold" gel helps for limited time. Any loaded flexion or abduction hurts. Very concerned about ability to work. Will occasionally have some numbness that goes down his arm from his shoulder that resolves with 5 minutes.  Past Medical History:  Diagnosis Date   Bipolar 1 disorder, manic, moderate (HCC)    HTN (hypertension)    normal renin 2008   Obese    Plantar fasciitis    Seizure (HCC)    reported massive seizure in 2008    Current Outpatient Medications on File Prior to Visit  Medication Sig Dispense Refill   cephALEXin (KEFLEX) 500 MG capsule Take 1 capsule (500 mg total) by mouth 3 (three) times daily. (Patient not taking: Reported on 12/23/2022) 21 capsule 0   diazepam (VALIUM) 10 MG tablet Take 10 mg by mouth as needed for anxiety.      diphenhydrAMINE (BENADRYL) 25 mg capsule Take 1 capsule (25 mg total) by mouth every 6 (six) hours as needed. 30 capsule 0   Glucosamine HCl (GLUCOSAMINE PO) Take 1 tablet by mouth at bedtime.     hydrochlorothiazide (HYDRODIURIL) 25 MG tablet Take 1 tablet (25 mg total) by mouth daily. 90 tablet 3   lamoTRIgine (LAMICTAL) 200 MG tablet Take 400 mg by mouth at bedtime.      lisinopril (ZESTRIL) 40 MG tablet Take 1 tablet (40 mg total) by mouth daily. 90 tablet 3   Multiple Vitamin (MULTIVITAMIN ADULT PO) Take 1 tablet by mouth at bedtime.     naproxen (NAPROSYN) 500 MG tablet Take 1 tablet (500 mg total) by mouth 2  (two) times daily with a meal. 30 tablet 0   omeprazole (PRILOSEC) 40 MG capsule Take 1 capsule by mouth once daily (Patient not taking: Reported on 12/23/2022) 30 capsule 6   polyethylene glycol powder (GLYCOLAX/MIRALAX) 17 GM/SCOOP powder Take 17 g by mouth 2 (two) times daily as needed for moderate constipation. 3350 g 1   No current facility-administered medications on file prior to visit.    Past Surgical History:  Procedure Laterality Date   TONSILECTOMY, ADENOIDECTOMY, BILATERAL MYRINGOTOMY AND TUBES     1996    No Known Allergies  BP 138/88   Ht 5\' 9"  (1.753 m)   Wt 298 lb (135.2 kg)   BMI 44.01 kg/m       No data to display              No data to display              Objective:  Physical Exam:  Gen: NAD, comfortable in exam room  Right shoulder Inspection: No obvious deformity, erythema, swelling, ecchymoses Palpation: Mildly TTP at proximal biceps tendon, AC joint ROM: Shoulder flexion and abduction active limited to 45 degrees, full ROM other, passive full ROM internal and external rotation Special Tests: Positive empty can, positive Hawkins, positive speeds, positive yergason, positive crossarm, negative Neer, negative O'brien Strength: 5/5 shoulder flexion, abduction, internal and external rotation, all  resisted strength reproduces pain in anterior and posterior shoulder  Complete Shoulder [place]:  -biceps tendon SAX and LAX show intact fibers with mild proximal intra-tendon sheath hypoechoic change -pec major tendon intact -subscapularis tendon intact, generalized hypoechoic change to muscle body and tendon -AC joint without Geyser sign, distal clavicle with mild cortical irregularity -Infraspinatus intact, generalized hypoechoic change to muscle body and tendon -Supraspinatus intact, generalized hypoechoic change to muscle body and tendon, mild to moderate hypoechoic change with subacromial bursa -posterior glenohumeral joint, limited view due to  body habitus, no signs of labral pathology or joint effusion Impression: Mild tenosynovitis of biceps tendon. Mild degenerative changes of AC joint. Moderate subacromial bursitis.  Rotator cuff tendinopathy.   Assessment & Plan:   Assessment & Plan Right shoulder pain, unspecified chronicity 2/2 subacromial impingement due to subacromial bursitis as well as biceps tenosynovitis. Reassuringly, no partial or complete rotator cuff tear visualized on [place] which is consistent with patient's strength exam. Discussed findings with patient who is agreeable to plan below. -Counseled on avoiding painful activities (limited overhead movement, especially with weights) -Diclofenac 75mg  BID prn -Stop other NSAIDs -Discussed possible benefit of future subacromial steroid injection -Home exercise scapular and rotator cuff strengthening exercises provided -Follow-up 6 weeks  Some or all of this note was dictated using Dragon software, there may be unintentional errors present.  Celine Mans, MD, PGY-2 [facility] 10:28 AM 01/02/2023

## 2023-01-04 DIAGNOSIS — G4733 Obstructive sleep apnea (adult) (pediatric): Secondary | ICD-10-CM | POA: Diagnosis not present

## 2023-02-07 DIAGNOSIS — G4733 Obstructive sleep apnea (adult) (pediatric): Secondary | ICD-10-CM | POA: Diagnosis not present

## 2023-02-13 ENCOUNTER — Ambulatory Visit (INDEPENDENT_AMBULATORY_CARE_PROVIDER_SITE_OTHER): Payer: BC Managed Care – PPO | Admitting: Family Medicine

## 2023-02-13 ENCOUNTER — Encounter: Payer: Self-pay | Admitting: Family Medicine

## 2023-02-13 VITALS — BP 130/75 | Ht 69.0 in | Wt 298.0 lb

## 2023-02-13 DIAGNOSIS — M25511 Pain in right shoulder: Secondary | ICD-10-CM

## 2023-02-13 MED ORDER — NITROGLYCERIN 0.2 MG/HR TD PT24: MEDICATED_PATCH | TRANSDERMAL | 1 refills | Status: DC

## 2023-02-13 NOTE — Patient Instructions (Signed)

## 2023-02-13 NOTE — Progress Notes (Signed)
PCP: Gerrit Heck, DO  Chief Complaint: Right shoulder pain Subjective:   HPI: Patient is a 37 y.o. male here for follow-up of his right shoulder pain.  Patient states that he has been doing the exercises, taking the medication but notes minimal improvement despite these intervention.  Patient states that has been taking Aleve and Excedrin that does help a little bit.  Patient states that he has been having difficult time at work and is trying to lift less than 15 pounds but at times has to lift more than 15 pounds.  Patient said the pain feels similar/the same as last time.  Patient states that he would like to avoid any sort of steroid/needle injection at this time.  Patient notes that his work schedule is little bit difficult and will also be hard for him to go to formal PT throughout the week.   Past Medical History:  Diagnosis Date   Bipolar 1 disorder, manic, moderate (HCC)    HTN (hypertension)    normal renin 2008   Obese    Plantar fasciitis    Seizure (HCC)    reported massive seizure in 2008    Current Outpatient Medications on File Prior to Visit  Medication Sig Dispense Refill   cephALEXin (KEFLEX) 500 MG capsule Take 1 capsule (500 mg total) by mouth 3 (three) times daily. (Patient not taking: Reported on 12/23/2022) 21 capsule 0   diazepam (VALIUM) 10 MG tablet Take 10 mg by mouth as needed for anxiety.      diclofenac (VOLTAREN) 75 MG EC tablet Take 1 tablet (75 mg total) by mouth 2 (two) times daily. 60 tablet 1   diphenhydrAMINE (BENADRYL) 25 mg capsule Take 1 capsule (25 mg total) by mouth every 6 (six) hours as needed. 30 capsule 0   Glucosamine HCl (GLUCOSAMINE PO) Take 1 tablet by mouth at bedtime.     hydrochlorothiazide (HYDRODIURIL) 25 MG tablet Take 1 tablet (25 mg total) by mouth daily. 90 tablet 3   lamoTRIgine (LAMICTAL) 200 MG tablet Take 400 mg by mouth at bedtime.      lisinopril (ZESTRIL) 40 MG tablet Take 1 tablet (40 mg total) by mouth daily. 90  tablet 3   Multiple Vitamin (MULTIVITAMIN ADULT PO) Take 1 tablet by mouth at bedtime.     naproxen (NAPROSYN) 500 MG tablet Take 1 tablet (500 mg total) by mouth 2 (two) times daily with a meal. 30 tablet 0   omeprazole (PRILOSEC) 40 MG capsule Take 1 capsule by mouth once daily (Patient not taking: Reported on 12/23/2022) 30 capsule 6   polyethylene glycol powder (GLYCOLAX/MIRALAX) 17 GM/SCOOP powder Take 17 g by mouth 2 (two) times daily as needed for moderate constipation. 3350 g 1   No current facility-administered medications on file prior to visit.    Past Surgical History:  Procedure Laterality Date   TONSILECTOMY, ADENOIDECTOMY, BILATERAL MYRINGOTOMY AND TUBES     1996    No Known Allergies  BP 130/75   Ht 5\' 9"  (1.753 m)   Wt 298 lb (135.2 kg)   BMI 44.01 kg/m       No data to display              No data to display              Objective:  Physical Exam:  Gen: NAD, comfortable in exam room  Right shoulder: Inspection reveals no gross abnormality of the right shoulder, range of motion is full/normal in internal,  external rotation; flexion to about 110 degrees, patient able to abduct to about 90/100 degrees actively.  Full passive motion.  There is pain against resisted external rotation, and pain with passive abduction.  There is some tenderness to palpation over the bicipital groove, no tenderness to palpation elsewhere.  Strength is 5 out of 5 with internal rotation, 4 out of 5 with external rotation and 4 out of 5 with abduction.    Neer's impingement: Positive Hawkins: Negative Empty can: Positive Crossarm: Pain but not at [facility] joint Internal/external rotation: Negative O'Brien's test: Negative   Assessment & Plan:  1. Right shoulder pain, unspecified chronicity -Patient with minimal improvement of his right shoulder pain likely secondary to subacromial impingement with bursitis.  Patient also has a component of some biceps tenosynovitis.  Given that  patient has had minimal improvement despite interventions, we will continue with work restrictions for patient.  Did discuss with patient options regarding formal PT, steroid injection or trying new medication.  Patient states that at this time he would like to try new medication, we will go ahead and do a nitroglycerin patch.  Patient given instructions to cut patch into one quarter and to apply for 24 hours at different sites on the shoulder.  Patient will attempt this and continue with his work restrictions for the next 6 weeks, patient to follow-up at that time for reevaluation.    Brenton Grills MD, PGY-4  Sports Medicine Fellow [facility]

## 2023-03-10 DIAGNOSIS — G4733 Obstructive sleep apnea (adult) (pediatric): Secondary | ICD-10-CM | POA: Diagnosis not present

## 2023-03-27 ENCOUNTER — Encounter: Payer: Self-pay | Admitting: Family Medicine

## 2023-03-27 ENCOUNTER — Ambulatory Visit (INDEPENDENT_AMBULATORY_CARE_PROVIDER_SITE_OTHER): Payer: BC Managed Care – PPO | Admitting: Family Medicine

## 2023-03-27 VITALS — BP 138/88 | Ht 69.0 in | Wt 298.0 lb

## 2023-03-27 DIAGNOSIS — M25511 Pain in right shoulder: Secondary | ICD-10-CM

## 2023-03-27 NOTE — Progress Notes (Signed)
PCP: Gerrit Heck, DO  Subjective:   HPI: Patient is a 38 y.o. male here for right shoulder pain.  12/9: Patient states that he has been doing the exercises, taking the medication but notes minimal improvement despite these intervention.  Patient states that has been taking Aleve and Excedrin that does help a little bit.  Patient states that he has been having difficult time at work and is trying to lift less than 15 pounds but at times has to lift more than 15 pounds.  Patient said the pain feels similar/the same as last time.  Patient states that he would like to avoid any sort of steroid/needle injection at this time.  Patient notes that his work schedule is little bit difficult and will also be hard for him to go to formal PT throughout the week.  03/27/23: Patient reports he continues to slowly improve. Pain is manageable. Admits not doing exercises at home. Taking tylenol or ibuprofen if he needs something. Used nitroglycerin patches but these gave him a headache.  Past Medical History:  Diagnosis Date   Bipolar 1 disorder, manic, moderate (HCC)    HTN (hypertension)    normal renin 2008   Obese    Plantar fasciitis    Seizure (HCC)    reported massive seizure in 2008    Current Outpatient Medications on File Prior to Visit  Medication Sig Dispense Refill   cephALEXin (KEFLEX) 500 MG capsule Take 1 capsule (500 mg total) by mouth 3 (three) times daily. (Patient not taking: Reported on 12/23/2022) 21 capsule 0   diazepam (VALIUM) 10 MG tablet Take 10 mg by mouth as needed for anxiety.      diclofenac (VOLTAREN) 75 MG EC tablet Take 1 tablet (75 mg total) by mouth 2 (two) times daily. 60 tablet 1   diphenhydrAMINE (BENADRYL) 25 mg capsule Take 1 capsule (25 mg total) by mouth every 6 (six) hours as needed. 30 capsule 0   Glucosamine HCl (GLUCOSAMINE PO) Take 1 tablet by mouth at bedtime.     hydrochlorothiazide (HYDRODIURIL) 25 MG tablet Take 1 tablet (25 mg total) by mouth  daily. 90 tablet 3   lamoTRIgine (LAMICTAL) 200 MG tablet Take 400 mg by mouth at bedtime.      lisinopril (ZESTRIL) 40 MG tablet Take 1 tablet (40 mg total) by mouth daily. 90 tablet 3   Multiple Vitamin (MULTIVITAMIN ADULT PO) Take 1 tablet by mouth at bedtime.     naproxen (NAPROSYN) 500 MG tablet Take 1 tablet (500 mg total) by mouth 2 (two) times daily with a meal. 30 tablet 0   nitroGLYCERIN (NITRODUR - DOSED IN MG/24 HR) 0.2 mg/hr patch Use 1/4 patch daily to the affected area. 30 patch 1   omeprazole (PRILOSEC) 40 MG capsule Take 1 capsule by mouth once daily (Patient not taking: Reported on 12/23/2022) 30 capsule 6   polyethylene glycol powder (GLYCOLAX/MIRALAX) 17 GM/SCOOP powder Take 17 g by mouth 2 (two) times daily as needed for moderate constipation. 3350 g 1   No current facility-administered medications on file prior to visit.    Past Surgical History:  Procedure Laterality Date   TONSILECTOMY, ADENOIDECTOMY, BILATERAL MYRINGOTOMY AND TUBES     1996    No Known Allergies  BP 138/88   Ht 5\' 9"  (1.753 m)   Wt 298 lb (135.2 kg)   BMI 44.01 kg/m       No data to display  No data to display              Objective:  Physical Exam:  Gen: NAD, comfortable in exam room  Right shoulder: No swelling, ecchymoses.  No gross deformity. No TTP. FROM. Negative Hawkins, positive Neers. Negative Yergasons. Strength 5/5 with empty can and resisted internal/external rotation. NV intact distally.   Assessment & Plan:  1. Right shoulder pain - slowly improving from impingement and biceps tenosynovitis.  Encouraged home exercises.  Tylenol, ibuprofen if needed.  Consider formal physical therapy, subacromial injection if not improving.  F/u prn.

## 2023-04-19 ENCOUNTER — Encounter: Payer: Self-pay | Admitting: Gastroenterology

## 2023-05-18 ENCOUNTER — Encounter: Payer: Self-pay | Admitting: Family Medicine

## 2023-05-18 ENCOUNTER — Ambulatory Visit: Payer: BC Managed Care – PPO | Admitting: Family Medicine

## 2023-05-18 VITALS — BP 118/70 | HR 95 | Ht 69.0 in | Wt 273.6 lb

## 2023-05-18 DIAGNOSIS — Z0000 Encounter for general adult medical examination without abnormal findings: Secondary | ICD-10-CM

## 2023-05-18 NOTE — Progress Notes (Signed)
 Appointment cut short due to patient needing to leave for personal reasons. Lab visit scheduled, plan to follow up after.

## 2023-05-22 ENCOUNTER — Other Ambulatory Visit: Payer: Self-pay

## 2023-07-04 ENCOUNTER — Other Ambulatory Visit: Payer: Self-pay

## 2023-07-04 ENCOUNTER — Encounter: Payer: Self-pay | Admitting: Gastroenterology

## 2023-07-04 MED ORDER — LAMOTRIGINE 200 MG PO TABS: 400.0000 mg | ORAL_TABLET | Freq: Every day | ORAL | 0 refills | Status: DC

## 2023-07-04 NOTE — Telephone Encounter (Signed)
 Patient calls nurse line requesting rx refill on Lamictal. He reports that Dr. Deborra Falter has been prescribing, however, will not provide refill until he is seen again in their office. Patient reports an outstanding balance that he is not able to pay at this time, making it difficult for him to make an appointment.   He was told to reach out to PCP office to see if we would be willing to send in refill.   Offered to schedule patient appointment with PCP to discuss further. Patient reports that he cannot afford to come into the office right now and is requesting returned call from PCP.   Advised that I would forward message to Dr. Annabell Key.   Elsie Halo, RN

## 2023-07-04 NOTE — Telephone Encounter (Signed)
 Called patient and advised of message per Dr. Annabell Key.   Patient appreciative.   Elsie Halo, RN

## 2023-07-14 ENCOUNTER — Ambulatory Visit

## 2023-08-09 ENCOUNTER — Ambulatory Visit (AMBULATORY_SURGERY_CENTER)

## 2023-08-09 VITALS — Ht 69.0 in | Wt 268.0 lb

## 2023-08-09 DIAGNOSIS — Z860100 Personal history of colon polyps, unspecified: Secondary | ICD-10-CM

## 2023-08-09 MED ORDER — NA SULFATE-K SULFATE-MG SULF 17.5-3.13-1.6 GM/177ML PO SOLN: 1.0000 | Freq: Once | ORAL | 0 refills | Status: AC

## 2023-08-09 NOTE — Progress Notes (Signed)
 Pre visit completed via phone call; Patient verified name, DOB, and address; No egg or soy allergy known to patient  No issues known to pt with past sedation with any surgeries or procedures Patient denies ever being told they had issues or difficulty with intubation  No FH of Malignant Hyperthermia Pt is not on diet pills Pt is not on home 02  Pt is not on blood thinners  Pt denies issues with constipation=  No A fib or A flutter Have any cardiac testing pending--NO Insurance verified during PV appt--- Medicaid Pt can ambulate without assistance;  Pt denies use of chewing tobacco Discussed diabetic/weight loss medication holds; Discussed NSAID holds; Checked BMI to be less than 50; Pt instructed to use [employer] or GoodRx for a price reduction on prep  Patient's chart reviewed by Rogena Class CNRA prior to previsit and patient appropriate for the [facility].  Pre visit completed and red dot placed by patient's name on their procedure day (on provider's schedule).    Instructions sent MyChart as well as printed and placed with 2nd floor receptionist for patient to pick up per his request;

## 2023-08-25 ENCOUNTER — Encounter: Payer: Self-pay | Admitting: Gastroenterology

## 2023-08-25 ENCOUNTER — Ambulatory Visit: Admitting: Gastroenterology

## 2023-08-25 VITALS — BP 139/83 | HR 75 | Temp 99.0°F | Resp 12 | Ht 69.0 in | Wt 268.0 lb

## 2023-08-25 DIAGNOSIS — K648 Other hemorrhoids: Secondary | ICD-10-CM

## 2023-08-25 DIAGNOSIS — Z860100 Personal history of colon polyps, unspecified: Secondary | ICD-10-CM

## 2023-08-25 DIAGNOSIS — F319 Bipolar disorder, unspecified: Secondary | ICD-10-CM | POA: Diagnosis not present

## 2023-08-25 DIAGNOSIS — Z860101 Personal history of adenomatous and serrated colon polyps: Secondary | ICD-10-CM | POA: Diagnosis not present

## 2023-08-25 DIAGNOSIS — I10 Essential (primary) hypertension: Secondary | ICD-10-CM | POA: Diagnosis not present

## 2023-08-25 DIAGNOSIS — Z1211 Encounter for screening for malignant neoplasm of colon: Secondary | ICD-10-CM

## 2023-08-25 MED ORDER — SODIUM CHLORIDE 0.9 % IV SOLN: 500.0000 mL | Freq: Once | INTRAVENOUS | Status: DC

## 2023-08-25 NOTE — Progress Notes (Signed)
 Pt sedate, gd SR's, VSS, report to RN

## 2023-08-25 NOTE — Op Note (Signed)
 [facility] Patient Name: Juan Grimes Procedure Date: 08/25/2023 12:53 PM MRN: [id number] Endoscopist: Ace Abu L. Dominic Friendly , MD, [id number] Age: 38 Referring MD:  Date of Birth: Jun 16, 1985 Gender: Male Account #: [id number] Procedure:                Colonoscopy Indications:              Personal history of colonic polyps                           Subcentimeter rectal tubular adenoma February 2022                            - poor prep on that diagnostic procedure Medicines:                Monitored Anesthesia Care Procedure:                Pre-Anesthesia Assessment:                           - Prior to the procedure, a History and Physical                            was performed, and patient medications and                            allergies were reviewed. The patient's tolerance of                            previous anesthesia was also reviewed. The risks                            and benefits of the procedure and the sedation                            options and risks were discussed with the patient.                            All questions were answered, and informed consent                            was obtained. Prior Anticoagulants: The patient has                            taken no anticoagulant or antiplatelet agents. ASA                            Grade Assessment: III - A patient with severe                            systemic disease. After reviewing the risks and                            benefits, the patient was deemed in satisfactory  condition to undergo the procedure.                           After obtaining informed consent, the colonoscope                            was passed under direct vision. Throughout the                            procedure, the patient's blood pressure, pulse, and                            oxygen saturations were monitored continuously. The                            CF [id number] #[id number] was  introduced through the anus                            and advanced to the the cecum, identified by                            appendiceal orifice and ileocecal valve. The                            colonoscopy was performed without difficulty. The                            patient tolerated the procedure well. The quality                            of the bowel preparation was good. The ileocecal                            valve, appendiceal orifice, and rectum were                            photographed. The bowel preparation used was 2 day                            Suprep/Miralax . Scope In: 1:02:53 PM Scope Out: 1:16:44 PM Scope Withdrawal Time: 0 hours 10 minutes 13 seconds  Total Procedure Duration: 0 hours 13 minutes 51 seconds  Findings:                 The perianal and digital rectal examinations were                            normal.                           Repeat examination of right colon under NBI                            performed.  Internal hemorrhoids were found.                           The exam was otherwise without abnormality on                            direct and retroflexion views. Complications:            No immediate complications. Estimated Blood Loss:     Estimated blood loss: none. Impression:               - Internal hemorrhoids.                           - The examination was otherwise normal on direct                            and retroflexion views.                           - No specimens collected. Recommendation:           - Patient has a contact number available for                            emergencies. The signs and symptoms of potential                            delayed complications were discussed with the                            patient. Return to normal activities tomorrow.                            Written discharge instructions were provided to the                            patient.                            - Resume previous diet.                           - Continue present medications.                           - Repeat colonoscopy in 7 years for surveillance. Juan Grimes L. Dominic Friendly, MD 08/25/2023 1:21:48 PM This report has been signed electronically.

## 2023-08-25 NOTE — Progress Notes (Signed)
 Pt's states no medical or surgical changes since previsit or office visit.

## 2023-08-25 NOTE — Patient Instructions (Addendum)
 Resume previous diet Continue present medications There were no colon polyps seen today!  You will need another screening colonoscopy in 7 years, you will receive a letter at that time when you are due for the procedure.   Please call us  at [phone] if you have a change in bowel habits, change in family history of colo-rectal cancer, rectal bleeding or other GI concern before that time.  Handouts/information  hemorrhoids  YOU HAD AN ENDOSCOPIC PROCEDURE TODAY AT THE [facility]:   Refer to the procedure report that was given to you for any specific questions about what was found during the examination.  If the procedure report does not answer your questions, please call your gastroenterologist to clarify.  If you requested that your care partner not be given the details of your procedure findings, then the procedure report has been included in a sealed envelope for you to review at your convenience later.  YOU SHOULD EXPECT: Some feelings of bloating in the abdomen. Passage of more gas than usual.  Walking can help get rid of the air that was put into your GI tract during the procedure and reduce the bloating. If you had a lower endoscopy (such as a colonoscopy or flexible sigmoidoscopy) you may notice spotting of blood in your stool or on the toilet paper. If you underwent a bowel prep for your procedure, you may not have a normal bowel movement for a few days.  Please Note:  You might notice some irritation and congestion in your nose or some drainage.  This is from the oxygen used during your procedure.  There is no need for concern and it should clear up in a day or so.  SYMPTOMS TO REPORT IMMEDIATELY:  Following lower endoscopy (colonoscopy):  Excessive amounts of blood in the stool  Significant tenderness or worsening of abdominal pains  Swelling of the abdomen that is new, acute  Fever of 100F or higher For urgent or emergent issues, a gastroenterologist can be reached  at any hour by calling [phone]. Do not use MyChart messaging for urgent concerns.   DIET:  We do recommend a small meal at first, but then you may proceed to your regular diet.  Drink plenty of fluids but you should avoid alcoholic beverages for 24 hours.  ACTIVITY:  You should plan to take it easy for the rest of today and you should NOT DRIVE or use heavy machinery until tomorrow (because of the sedation medicines used during the test).    FOLLOW UP: Our staff will call the number listed on your records the next business day following your procedure.  We will call around 7:15- 8:00 am to check on you and address any questions or concerns that you may have regarding the information given to you following your procedure. If we do not reach you, we will leave a message.     SIGNATURES/CONFIDENTIALITY: You and/or your care partner have signed paperwork which will be entered into your electronic medical record.  These signatures attest to the fact that that the information above on your After Visit Summary has been reviewed and is understood.  Full responsibility of the confidentiality of this discharge information lies with you and/or your care-partner.

## 2023-08-25 NOTE — Progress Notes (Signed)
 History and Physical:  This patient presents for endoscopic testing for: Encounter Diagnosis  Name Primary?   Hx of colonic polyps Yes    38 year old man here for colon polyp surveillance exam. Subcentimeter rectal tubular adenoma with poor bowel preparation and colonoscopy February 2022 Patient has chronic constipation  Patient is otherwise without complaints or active issues today.   Past Medical History: Past Medical History:  Diagnosis Date   Bipolar 1 disorder, manic, moderate (HCC)    HTN (hypertension)    normal renin 2008   Obese    Plantar fasciitis    Seizure (HCC)    reported massive seizure in 2008     Past Surgical History: Past Surgical History:  Procedure Laterality Date   COLONOSCOPY  2022   HD-MAC-plenvue(poor)-normal- EGD - normal   TONSILECTOMY, ADENOIDECTOMY, BILATERAL MYRINGOTOMY AND TUBES     1996    Allergies: No Known Allergies  Outpatient Meds: Current Outpatient Medications  Medication Sig Dispense Refill   hydrochlorothiazide  (HYDRODIURIL ) 25 MG tablet Take 1 tablet (25 mg total) by mouth daily. 90 tablet 3   lamoTRIgine  (LAMICTAL ) 200 MG tablet Take 2 tablets (400 mg total) by mouth at bedtime. 60 tablet 0   lisinopril  (ZESTRIL ) 40 MG tablet Take 1 tablet (40 mg total) by mouth daily. 90 tablet 3   diazepam (VALIUM) 10 MG tablet Take 10 mg by mouth as needed for anxiety.      diphenhydrAMINE  (BENADRYL ) 25 mg capsule Take 1 capsule (25 mg total) by mouth every 6 (six) hours as needed. (Patient not taking: Reported on 08/09/2023) 30 capsule 0   Glucosamine HCl (GLUCOSAMINE PO) Take 1 tablet by mouth at bedtime. (Patient not taking: Reported on 08/09/2023)     Multiple Vitamin (MULTIVITAMIN ADULT PO) Take 1 tablet by mouth at bedtime.     naproxen  (NAPROSYN ) 500 MG tablet Take 1 tablet (500 mg total) by mouth 2 (two) times daily with a meal. 30 tablet 0   Current Facility-Administered Medications  Medication Dose Route Frequency Provider Last  Rate Last Admin   0.9 %  sodium chloride  infusion  500 mL Intravenous Once Danis, Inocencio Roy L III, MD          ___________________________________________________________________ Objective   Exam:  BP 139/78   Pulse 76   Temp 99 F (37.2 C) (Temporal)   Resp 11   Ht 5' 9 (1.753 m)   Wt 268 lb (121.6 kg)   SpO2 96%   BMI 39.58 kg/m   CV: regular , S1/S2 Resp: clear to auscultation bilaterally, normal RR and effort noted GI: soft, no tenderness, with active bowel sounds.   Assessment: Encounter Diagnosis  Name Primary?   Hx of colonic polyps Yes     Plan: Colonoscopy   The benefits and risks of the planned procedure(s) were described in detail with the patient or (when appropriate) their health care proxy.  Risks were outlined as including, but not limited to, bleeding, infection, perforation, adverse medication reaction leading to cardiac or pulmonary decompensation, pancreatitis (if ERCP).  The limitation of incomplete mucosal visualization was also discussed.  No guarantees or warranties were given.  The patient is appropriate for an endoscopic procedure in the ambulatory setting.   - Lorella Roles, MD

## 2023-08-28 ENCOUNTER — Telehealth: Payer: Self-pay | Admitting: *Deleted

## 2023-08-28 NOTE — Telephone Encounter (Signed)
  Follow up Call-     08/25/2023   12:32 PM  Call back number  Post procedure Call Back phone  # [phone]  Permission to leave phone message Yes    Post procedure follow up phone call. No answer at number given.  Left message on voicemail.

## 2023-10-03 ENCOUNTER — Ambulatory Visit (INDEPENDENT_AMBULATORY_CARE_PROVIDER_SITE_OTHER)

## 2023-10-03 ENCOUNTER — Ambulatory Visit

## 2023-10-03 ENCOUNTER — Other Ambulatory Visit (HOSPITAL_COMMUNITY)
Admission: RE | Admit: 2023-10-03 | Discharge: 2023-10-03 | Disposition: A | Discharge status: Home / Self Care | Source: Ambulatory Visit | Attending: Family Medicine | Admitting: Family Medicine

## 2023-10-03 VITALS — BP 116/81 | HR 86 | Ht 69.0 in | Wt 262.2 lb

## 2023-10-03 DIAGNOSIS — Z113 Encounter for screening for infections with a predominantly sexual mode of transmission: Secondary | ICD-10-CM | POA: Insufficient documentation

## 2023-10-03 DIAGNOSIS — T148XXA Other injury of unspecified body region, initial encounter: Secondary | ICD-10-CM

## 2023-10-03 DIAGNOSIS — N451 Epididymitis: Secondary | ICD-10-CM

## 2023-10-03 DIAGNOSIS — R45851 Suicidal ideations: Secondary | ICD-10-CM

## 2023-10-03 MED ORDER — DOXYCYCLINE HYCLATE 100 MG PO TABS: 100.0000 mg | ORAL_TABLET | Freq: Two times a day (BID) | ORAL | 0 refills | Status: AC

## 2023-10-03 MED ORDER — CEFIXIME 400 MG PO CAPS: 800.0000 mg | ORAL_CAPSULE | Freq: Every day | ORAL | 0 refills | Status: AC

## 2023-10-03 MED ORDER — LAMOTRIGINE 200 MG PO TABS: 400.0000 mg | ORAL_TABLET | Freq: Every day | ORAL | 0 refills | Status: DC

## 2023-10-03 NOTE — Assessment & Plan Note (Signed)
 Expect to improve over the next month. Patient declined medication management with NSAIDs and muscle relaxers. Continue OTC home treatments. Offered HEP but patient declined.

## 2023-10-03 NOTE — Patient Instructions (Signed)
 Take the antiobiotics for the next 7 days.  ER precautions: Severe pain that doesn't go away, color changes like darkening of the skin, and increased swelling of the testicle

## 2023-10-03 NOTE — Progress Notes (Signed)
    SUBJECTIVE:   CHIEF COMPLAINT / HPI: neck pain, testicular pain  Neck pain most bothersome.  No injury. Right sided neck and superior shoulder pain. Stabbing shooting pain. 10/10 at its worst. 5-6/10 at its worst. A week of upper right arm pain to the elbow. Tried icy hot, biofreeze, lidocaine  patches without benefit.  Has tried otc tylenol  2 pills (unsure the dosage) and ibuprofen  400 mg with some benefit. Tried naproxen  1500 mg without improvement in the pain.  Reports no benefit with many NSAIDs in the past.  No numbness, tingling, or weakness in the upper extremities.  No lower extremity weakness, gait changes, or bowel abnormalities. 3 years of urinary continence. Predating neck pain.  No fevers.   2 weeks of left testicular pulsing every time he sits down.  Painful 8/10 when pulsing. 4-5/10 at baseline.  Concern for a mass on the testicle when he felt his testicle the other day.  Red discharge from the penis.  Not currently sexually active and no new partners.   He indicated on PHQ9 thoughts of suicide. On further discussion, he says that he thinks about killing himself everyday but isn't strong enough to do it. No plan. His motivations to stay on the earth are his family and not wanting to cause them pain. He has a lot of life stressors specifically financial that are hard to deal with. He has a psychiatrist but has not seen them because he cannot afford it. He does see a therapist.    PERTINENT  PMH / PSH: HTN, obesity  OBJECTIVE:   BP 116/81   Pulse 86   Ht 5' 9 (1.753 m)   Wt 262 lb 3.2 oz (118.9 kg)   SpO2 96%   BMI 38.72 kg/m    Cervical spine: Full ROM in flexion, extension, right and left rotation with some pain.  No tenderness to palpation of the cervical spinous processes, trapezius, or paraspinal muscles. Increased muscle tone in the trapezius bilaterally. (-) lhermitte's sign.    Testicular exam: left testicle is slightly edematous and erythematous.  Painful to touch. No palpable masses. Vertical lie. No skin darkening or necrosis. No lymphadenopathy. Chaperoned by Dr. Anders  ASSESSMENT/PLAN:   Assessment & Plan Muscle strain Expect to improve over the next month. Patient declined medication management with NSAIDs and muscle relaxers. Continue OTC home treatments. Offered HEP but patient declined.   Epididymitis Given 2 week gradual onset and exam findings with erythema, pain, and lack of necrotic symptoms, suspect epididymitis. Ordered urine Gc/chlam/trich and urinalysis. Patient declined IM injection. Prescribed Cefixime  800 mg one time and doxycycline  100 mg BID for 7 days for treatment.  Patient unwilling/unable to make follow up appointment next week due to work constraints. Gave strict ER precautions for symptom worsening, signs of necrosis, no improvement with antibiotics.  Routine screening for STI (sexually transmitted infection)  Ordered future HIV/RPR. Patient will try to come back for labs. Suicidal thoughts Patient has chronic SI without a plan. Known history of bipolar. Has psychiatrist and therapist. Refilled lamotrigine  400 mg today. Recommended patient go to ER if having worsening SI or plan formation. Will continue with therapist.      Elio Alena Morrison, MD [facility]

## 2023-10-05 LAB — URINE CYTOLOGY ANCILLARY ONLY
Chlamydia: NEGATIVE
Comment: NEGATIVE
Comment: NEGATIVE
Comment: NORMAL
Neisseria Gonorrhea: NEGATIVE
Trichomonas: NEGATIVE

## 2023-10-06 ENCOUNTER — Ambulatory Visit: Payer: Self-pay

## 2023-10-06 ENCOUNTER — Other Ambulatory Visit: Payer: Self-pay

## 2023-10-27 ENCOUNTER — Other Ambulatory Visit: Payer: Self-pay

## 2023-11-21 ENCOUNTER — Other Ambulatory Visit: Payer: Self-pay | Admitting: Family Medicine

## 2023-12-13 ENCOUNTER — Ambulatory Visit (INDEPENDENT_AMBULATORY_CARE_PROVIDER_SITE_OTHER): Admitting: Student

## 2023-12-13 VITALS — BP 112/80 | HR 78 | Ht 69.0 in | Wt 284.6 lb

## 2023-12-13 DIAGNOSIS — M542 Cervicalgia: Secondary | ICD-10-CM

## 2023-12-13 DIAGNOSIS — R350 Frequency of micturition: Secondary | ICD-10-CM | POA: Diagnosis present

## 2023-12-13 DIAGNOSIS — F319 Bipolar disorder, unspecified: Secondary | ICD-10-CM

## 2023-12-13 DIAGNOSIS — T148XXA Other injury of unspecified body region, initial encounter: Secondary | ICD-10-CM

## 2023-12-13 LAB — POCT URINE DIPSTICK
Bilirubin, UA: NEGATIVE
Blood, UA: NEGATIVE
Glucose, UA: NEGATIVE mg/dL
Ketones, POC UA: NEGATIVE mg/dL
Leukocytes, UA: NEGATIVE
Nitrite, UA: NEGATIVE
POC PROTEIN,UA: NEGATIVE
Spec Grav, UA: 1.02 (ref 1.010–1.025)
Urobilinogen, UA: 1 U/dL
pH, UA: 8.5 — AB (ref 5.0–8.0)

## 2023-12-13 MED ORDER — BACLOFEN 10 MG PO TABS: 10.0000 mg | ORAL_TABLET | Freq: Three times a day (TID) | ORAL | 0 refills | Status: AC | PRN

## 2023-12-13 MED ORDER — MELOXICAM 15 MG PO TABS: 15.0000 mg | ORAL_TABLET | Freq: Every day | ORAL | 0 refills | Status: AC

## 2023-12-13 NOTE — Patient Instructions (Addendum)
 Pleasure to see you today.  For your neck pain I suspect possible muscle sprain.  However I have sent an order for x-ray of your neck.  You can go to [facility] imaging on [address] [place] to complete the x-ray.  In addition I have sent in prescription for meloxicam  which you will take once daily for 30 days.  Do not take this with naproxen .  And also muscle relaxant baclofen which you can take as needed.  For baclofen it could make you sleepy so I recommend taking this only at night right before you go to bed.  For your increased urination we have gotten a urine sample to test for your urine.  I have also advised you to hold off on your hydrochlorothiazide  for a week to see if this improves your nighttime urination.  Below are a list of counselors who will covered under Medicaid.  Have also placed referral to psychiatry  For [facility] phone number is [phone] or [phone]  Possibly can consider at the [facility] phone number is [phone].    Therapy and Counseling Resources Most providers on this list will take Medicaid. Patients with commercial insurance or Medicare should contact their insurance company to get a list of in network providers.  [facility] (takes children) Location 1: [address] [place], [place] [address] Location 2: [address] [place], [place] [address] [phone]   [facility] (spanish speaking therapist available)(habla espanol)(take medicare and medicaid)  [address], [place], [place] [address], [place]  [email or web address] [phone]  [facility]iatry and Counseling [address] [facility] [address] [place], [place] [address] [phone]  [facility]   [address] [place], [place] [address]      [phone]  [facility] (spanish available) [address]  [place], [place] [address] [phone]  [facility] (take [facility] and medicare) [address], [address]  [place], [place] [address]       [phone]     [facility] (virtual only) [phone]  [facility] [address] [place], [place] [phone]  [facility]:  [address] [place] [phone]  [facility]:  [address] [place] [phone]  [facility] (under & uninsured) [address]   [place] [phone]    [email or web address]    [facility] [phone]  [place]    [phone]  [facility] -[address]     Phone:  [phone]     [facility]-  [address]  [phone]   [facility] [address]      [place], [place] [phone]  [facility]: [address] [place], [place], [place]  [phone]   [facility] (Spanish therapist) [email or web address]  [address]   [place] [address]    [phone]    [facility]   [address],  [place], [place]  [phone]   [facility]  [address] [place]  [phone]  [facility]  [address] [place], [place]        [phone]  Open Access/Walk In Clinic under & uninsured  [facility]  [address] [place], [place] [phone] Crisis [phone]  [facility]  [address],  (Spanish)   [address] [place]: [phone]) 8:30 - 12; 1 - 2:30  [facility] [employer],  [address] Floor, [place]    [phone]):8:30 - 12; 2 - 3PM  [facility] [employer],  [address],  [place]; [phone]):   Mon - Fri 8 AM - 5 PM  [facility] [address] [place]  MWF 12:30 to 3:00 or call to schedule an appointment   [phone]  Specific Provider options Psychology Today  [email or web address]/us  click on find a therapist  enter your zip code left side and select or tailor a therapist for your specific need.   [facility] Provider Directory [email or web address]  (Medicaid)   Follow all drop down to find a provider  Social Support program [facility] [place] [phone] or [employer] [address], [place], [place] Recovery support and educational   24- Hour Availability:   [facility]  [address] [place], [place] [phone] Crisis [phone]  [facility] [phone]  [facility]  [phone]   [facility]  [phone] (after hours)  Therapeutic Alternative/Mobile Crisis   [phone]  [place]  National Suicide Hotline  414 552 3238 MERRILYN)  Call 911 or go to emergency room  [facility]  [phone]);  [place] and [employer]  [phone]); [place], [place], [place], [place], [place], [place], [place]

## 2023-12-13 NOTE — Progress Notes (Unsigned)
    SUBJECTIVE:   CHIEF COMPLAINT / HPI:   38 year old male with history of bipolar, hypertension and morbid obesity Presenting for continued neck pain Patient reports symptoms have been present for the past 2-3 months. 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 Ibuprofen  and some unknown muscle relaxant with mild relief Patient expressed desire to get a neck Xray to look for possible disc herniation  Bipolar 1 disorder Patient expressed desire to get referral to psychiatry.  No longer seeing his prior psychiatrist given that he lost his job recently and wants to find a psychiatrist within the [facility] behavioral health system.  Reports occasionally feeling better of dead than alive but has no intention for self-harm or homicidal ideations.  Nocturnal polyuria Patient reports having to wake up multiple times throughout the night to void.  Denies any difficulty with the initiation of void.  Symptoms have been chronic however in the last week or 2 has had to get up multiple times throughout the night.  Denies being on lithium   PERTINENT  PMH / PSH: Reviewed   OBJECTIVE:   BP 112/80   Pulse 78   Ht 5' 9 (1.753 m)   Wt 284 lb 9.6 oz (129.1 kg)   SpO2 93%   BMI 42.03 kg/m     Physical Exam General: Alert, well appearing, NAD Cardiovascular: RRR, No Murmurs, Normal S2/S2 Respiratory: CTAB, No wheezing or Rales MSK: diffused posterior neck tenderness with limited motion due to pain. No notable skin changes or deformity. Extremities: Normal strength on all extremities, NVI    ASSESSMENT/PLAN:   Bipolar I disorder (HCC) At baseline, no active SI/HI. Placed Psych referral at patient's request and provided patient list of Medicaid therapist. Return precautions discussed with patient.   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  -Recommend Tylenol  650 mg every 8 hours as needed. 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 Doesn't appear to be due to hyperglycemia. UA was negative. Current medication include hydrochlorothiazide  which patient takes nightly. Advised patient to Hold hydrochlorothiazide  for a week to assess for improvement of symptom.   Norleen April, MD [facility]

## 2023-12-14 ENCOUNTER — Ambulatory Visit: Admission: RE | Admit: 2023-12-14 | Discharge: 2023-12-14 | Disposition: A | Source: Ambulatory Visit

## 2023-12-14 DIAGNOSIS — M542 Cervicalgia: Secondary | ICD-10-CM | POA: Diagnosis not present

## 2023-12-15 NOTE — Assessment & Plan Note (Signed)
 At baseline, no active SI/HI. Placed Psych referral at patient's request and provided patient list of Medicaid therapist. Return precautions discussed with patient.

## 2023-12-15 NOTE — Assessment & Plan Note (Signed)
 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  -Recommend Tylenol  650 mg every 8 hours as needed. 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

## 2023-12-21 ENCOUNTER — Ambulatory Visit: Payer: Self-pay | Admitting: Student

## 2023-12-23 ENCOUNTER — Other Ambulatory Visit: Payer: Self-pay | Admitting: Family Medicine

## 2024-01-09 DIAGNOSIS — J069 Acute upper respiratory infection, unspecified: Secondary | ICD-10-CM | POA: Diagnosis not present

## 2024-01-09 DIAGNOSIS — R300 Dysuria: Secondary | ICD-10-CM | POA: Diagnosis not present

## 2024-01-09 DIAGNOSIS — R52 Pain, unspecified: Secondary | ICD-10-CM | POA: Diagnosis not present

## 2024-01-09 DIAGNOSIS — R110 Nausea: Secondary | ICD-10-CM | POA: Diagnosis not present

## 2024-01-23 ENCOUNTER — Other Ambulatory Visit: Payer: Self-pay | Admitting: Family Medicine
